# Patient Record
Sex: Female | Born: 1935 | Race: White | Hispanic: No | Marital: Single
Health system: Southern US, Community
[De-identification: ages and names within clinical notes are randomized; demographics above are authoritative.]

## PROBLEM LIST (undated history)

## (undated) DIAGNOSIS — I10 Essential (primary) hypertension: Secondary | ICD-10-CM

## (undated) DIAGNOSIS — I2699 Other pulmonary embolism without acute cor pulmonale: Secondary | ICD-10-CM

## (undated) DIAGNOSIS — R42 Dizziness and giddiness: Secondary | ICD-10-CM

## (undated) DIAGNOSIS — I82409 Acute embolism and thrombosis of unspecified deep veins of unspecified lower extremity: Secondary | ICD-10-CM

## (undated) DIAGNOSIS — R262 Difficulty in walking, not elsewhere classified: Secondary | ICD-10-CM

## (undated) HISTORY — PX: BACK SURGERY: SHX140

## (undated) HISTORY — PX: OTHER SURGICAL HISTORY: SHX169

## (undated) SURGERY — OPEN REDUCTION INTERNAL FIXATION HIP
Anesthesia: Spinal | Laterality: Left

---

## 2015-09-21 ENCOUNTER — Encounter (HOSPITAL_COMMUNITY): Payer: Self-pay | Admitting: *Deleted

## 2015-09-21 ENCOUNTER — Inpatient Hospital Stay (HOSPITAL_COMMUNITY)
Admission: EM | Admit: 2015-09-21 | Discharge: 2015-09-26 | DRG: 481 | Disposition: A | Discharge status: Skilled Nursing Facility | Payer: Medicare Other | Attending: Internal Medicine | Admitting: Internal Medicine

## 2015-09-21 ENCOUNTER — Emergency Department (HOSPITAL_COMMUNITY): Payer: Medicare Other

## 2015-09-21 DIAGNOSIS — S72001A Fracture of unspecified part of neck of right femur, initial encounter for closed fracture: Secondary | ICD-10-CM | POA: Diagnosis present

## 2015-09-21 DIAGNOSIS — Z791 Long term (current) use of non-steroidal anti-inflammatories (NSAID): Secondary | ICD-10-CM

## 2015-09-21 DIAGNOSIS — Z01818 Encounter for other preprocedural examination: Secondary | ICD-10-CM | POA: Diagnosis not present

## 2015-09-21 DIAGNOSIS — M25552 Pain in left hip: Secondary | ICD-10-CM | POA: Diagnosis present

## 2015-09-21 DIAGNOSIS — D62 Acute posthemorrhagic anemia: Secondary | ICD-10-CM | POA: Diagnosis not present

## 2015-09-21 DIAGNOSIS — W19XXXA Unspecified fall, initial encounter: Secondary | ICD-10-CM | POA: Diagnosis present

## 2015-09-21 DIAGNOSIS — I10 Essential (primary) hypertension: Secondary | ICD-10-CM | POA: Diagnosis present

## 2015-09-21 DIAGNOSIS — Z419 Encounter for procedure for purposes other than remedying health state, unspecified: Secondary | ICD-10-CM

## 2015-09-21 DIAGNOSIS — E785 Hyperlipidemia, unspecified: Secondary | ICD-10-CM | POA: Diagnosis present

## 2015-09-21 DIAGNOSIS — S72002A Fracture of unspecified part of neck of left femur, initial encounter for closed fracture: Secondary | ICD-10-CM | POA: Diagnosis not present

## 2015-09-21 DIAGNOSIS — S72142A Displaced intertrochanteric fracture of left femur, initial encounter for closed fracture: Principal | ICD-10-CM | POA: Diagnosis present

## 2015-09-21 HISTORY — DX: Difficulty in walking, not elsewhere classified: R26.2

## 2015-09-21 HISTORY — DX: Essential (primary) hypertension: I10

## 2015-09-21 HISTORY — DX: Dizziness and giddiness: R42

## 2015-09-21 LAB — CBC WITH DIFFERENTIAL/PLATELET
BASOS ABS: 0.1 10*3/uL (ref 0.0–0.1)
Basophils Relative: 1 %
Eosinophils Absolute: 0.1 10*3/uL (ref 0.0–0.7)
Eosinophils Relative: 1 %
HEMATOCRIT: 38.9 % (ref 36.0–46.0)
Hemoglobin: 12.8 g/dL (ref 12.0–15.0)
LYMPHS PCT: 11 %
Lymphs Abs: 1.2 10*3/uL (ref 0.7–4.0)
MCH: 30.1 pg (ref 26.0–34.0)
MCHC: 32.9 g/dL (ref 30.0–36.0)
MCV: 91.5 fL (ref 78.0–100.0)
Monocytes Absolute: 0.6 10*3/uL (ref 0.1–1.0)
Monocytes Relative: 5 %
NEUTROS ABS: 9.1 10*3/uL — AB (ref 1.7–7.7)
Neutrophils Relative %: 82 %
PLATELETS: 209 10*3/uL (ref 150–400)
RBC: 4.25 MIL/uL (ref 3.87–5.11)
RDW: 13.4 % (ref 11.5–15.5)
WBC: 11 10*3/uL — AB (ref 4.0–10.5)

## 2015-09-21 LAB — BASIC METABOLIC PANEL
ANION GAP: 7 (ref 5–15)
BUN: 21 mg/dL — ABNORMAL HIGH (ref 6–20)
CO2: 26 mmol/L (ref 22–32)
Calcium: 9.4 mg/dL (ref 8.9–10.3)
Chloride: 104 mmol/L (ref 101–111)
Creatinine, Ser: 0.77 mg/dL (ref 0.44–1.00)
Glucose, Bld: 110 mg/dL — ABNORMAL HIGH (ref 65–99)
POTASSIUM: 3.9 mmol/L (ref 3.5–5.1)
SODIUM: 137 mmol/L (ref 135–145)

## 2015-09-21 LAB — TYPE AND SCREEN
ABO/RH(D): A POS
ANTIBODY SCREEN: NEGATIVE

## 2015-09-21 LAB — ABO/RH: ABO/RH(D): A POS

## 2015-09-21 LAB — PROTIME-INR
INR: 1
Prothrombin Time: 13.2 seconds (ref 11.4–15.2)

## 2015-09-21 SURGERY — Surgical Case
Anesthesia: *Unknown

## 2015-09-21 MED ORDER — MORPHINE SULFATE (PF) 2 MG/ML IV SOLN
0.5000 mg | INTRAVENOUS | Status: DC | PRN
  Administered 2015-09-21: 0.5 mg via INTRAVENOUS
  Filled 2015-09-21: qty 1

## 2015-09-21 MED ORDER — POLYETHYLENE GLYCOL 3350 17 G PO PACK
17.0000 g | PACK | Freq: Every day | ORAL | Status: DC | PRN
  Administered 2015-09-26: 17 g via ORAL
  Filled 2015-09-21: qty 1

## 2015-09-21 MED ORDER — HYDROCODONE-ACETAMINOPHEN 5-325 MG PO TABS
1.0000 | ORAL_TABLET | ORAL | Status: DC | PRN
  Administered 2015-09-21: 1 via ORAL
  Filled 2015-09-21: qty 1

## 2015-09-21 MED ORDER — ONDANSETRON HCL 4 MG/2ML IJ SOLN: 4.0000 mg | Freq: Three times a day (TID) | INTRAMUSCULAR | Status: DC | PRN

## 2015-09-21 MED ORDER — SODIUM CHLORIDE 0.9 % IV SOLN
INTRAVENOUS | Status: DC
  Administered 2015-09-22 (×2): via INTRAVENOUS

## 2015-09-21 MED ORDER — OXYBUTYNIN CHLORIDE ER 5 MG PO TB24
5.0000 mg | ORAL_TABLET | Freq: Every day | ORAL | Status: DC
  Administered 2015-09-22 – 2015-09-26 (×5): 5 mg via ORAL
  Filled 2015-09-21 (×5): qty 1

## 2015-09-21 MED ORDER — SODIUM CHLORIDE 0.45 % IV SOLN
INTRAVENOUS | Status: DC
  Administered 2015-09-21: 17:00:00 via INTRAVENOUS

## 2015-09-21 MED ORDER — SIMVASTATIN 40 MG PO TABS
40.0000 mg | ORAL_TABLET | Freq: Every day | ORAL | Status: DC
  Administered 2015-09-22 – 2015-09-25 (×4): 40 mg via ORAL
  Filled 2015-09-21 (×4): qty 1

## 2015-09-21 MED ORDER — MORPHINE SULFATE (PF) 4 MG/ML IV SOLN
4.0000 mg | INTRAVENOUS | Status: DC | PRN
  Administered 2015-09-21: 4 mg via INTRAVENOUS
  Filled 2015-09-21: qty 1

## 2015-09-21 MED ORDER — ONDANSETRON HCL 4 MG/2ML IJ SOLN
4.0000 mg | Freq: Once | INTRAMUSCULAR | Status: AC
  Administered 2015-09-21: 4 mg via INTRAVENOUS
  Filled 2015-09-21: qty 2

## 2015-09-21 MED ORDER — ENOXAPARIN SODIUM 40 MG/0.4ML ~~LOC~~ SOLN: 40.0000 mg | SUBCUTANEOUS | Status: DC

## 2015-09-21 MED ORDER — SODIUM CHLORIDE 0.9 % IV BOLUS (SEPSIS)
1000.0000 mL | Freq: Once | INTRAVENOUS | Status: AC
  Administered 2015-09-21: 1000 mL via INTRAVENOUS

## 2015-09-21 MED ORDER — TRAZODONE HCL 100 MG PO TABS
100.0000 mg | ORAL_TABLET | Freq: Every day | ORAL | Status: DC
  Administered 2015-09-21 – 2015-09-25 (×5): 100 mg via ORAL
  Filled 2015-09-21 (×5): qty 1

## 2015-09-21 MED ORDER — HYDROCODONE-ACETAMINOPHEN 5-325 MG PO TABS
1.0000 | ORAL_TABLET | Freq: Four times a day (QID) | ORAL | Status: DC | PRN
  Administered 2015-09-23 – 2015-09-24 (×3): 2 via ORAL
  Administered 2015-09-25 – 2015-09-26 (×3): 1 via ORAL
  Filled 2015-09-21 (×2): qty 2
  Filled 2015-09-21 (×3): qty 1
  Filled 2015-09-21: qty 2
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
## 2015-09-21 NOTE — Progress Notes (Signed)
EDCM discussed patient with [place] of [facility] and notified her of patient's admission to [facility].

## 2015-09-21 NOTE — ED Notes (Addendum)
Patient can go to floor at 1540

## 2015-09-21 NOTE — ED Notes (Signed)
Hospitalist states that pt will be going [facility]

## 2015-09-21 NOTE — Progress Notes (Signed)
Orthopedic Tech Progress Note Patient Details:  Megan MilchCallie Murillo Sep 14, 1923 [id number]  Musculoskeletal Traction Type of Traction: Bucks Skin Traction Traction Location: LLE Traction Weight: 5 lbs    Jennye MoccasinHughes, [name] 09/21/2015, 9:14 PM

## 2015-09-21 NOTE — ED Triage Notes (Signed)
Patient not in lobby when called for triage. 

## 2015-09-21 NOTE — ED Triage Notes (Signed)
Per EMS, pt from home.  Pt had fall this morning.  Pt had no LOC or head injury.  No blood thinners.  Pt c/o Left femur pain with movement.  Leg is shortened and rotated.  Pelvic binder and head immobilization on arrival.  Alert and Oriented.  Vitals:  146/70, hr 64, 95% ra, resp 18

## 2015-09-21 NOTE — Progress Notes (Signed)
Orthopedic Tech Progress Note Patient Details:  Megan MilchCallie Murillo 09-06-1923 [id number] Ortho visit delivered sandbag Patient ID: Megan Milchallie Murillo, female   DOB: 09-06-1923, 82 y.o.   MRN: [id number]   Jennye MoccasinHughes, [name] 09/21/2015, 8:38 PM
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
## 2015-09-21 NOTE — ED Notes (Signed)
[facility] to [facility]

## 2015-09-21 NOTE — Progress Notes (Signed)
[facility] Elizebeth KollerMary of [facility] her of [facility].

## 2015-09-22 ENCOUNTER — Encounter (HOSPITAL_COMMUNITY)
Admission: EM | Disposition: A | Discharge status: Skilled Nursing Facility | Payer: Self-pay | Source: Home / Self Care | Attending: Internal Medicine

## 2015-09-22 ENCOUNTER — Inpatient Hospital Stay (HOSPITAL_COMMUNITY): Payer: Medicare Other

## 2015-09-22 ENCOUNTER — Inpatient Hospital Stay (HOSPITAL_COMMUNITY): Payer: Medicare Other | Admitting: Anesthesiology

## 2015-09-22 DIAGNOSIS — Z01818 Encounter for other preprocedural examination: Secondary | ICD-10-CM

## 2015-09-22 DIAGNOSIS — I10 Essential (primary) hypertension: Secondary | ICD-10-CM

## 2015-09-22 DIAGNOSIS — S72002A Fracture of unspecified part of neck of left femur, initial encounter for closed fracture: Secondary | ICD-10-CM

## 2015-09-22 HISTORY — PX: COMPRESSION HIP SCREW: SHX1386

## 2015-09-22 LAB — MRSA PCR SCREENING: MRSA by PCR: NEGATIVE

## 2015-09-22 LAB — TYPE AND SCREEN
ABO/RH(D): A POS
Antibody Screen: NEGATIVE

## 2015-09-22 LAB — URINE MICROSCOPIC-ADD ON: RBC / HPF: NONE SEEN RBC/hpf (ref 0–5)

## 2015-09-22 LAB — URINALYSIS, ROUTINE W REFLEX MICROSCOPIC
Bilirubin Urine: NEGATIVE
Glucose, UA: NEGATIVE mg/dL
Hgb urine dipstick: NEGATIVE
Ketones, ur: NEGATIVE mg/dL
Nitrite: NEGATIVE
Protein, ur: NEGATIVE mg/dL
Specific Gravity, Urine: 1.018 (ref 1.005–1.030)
pH: 5 (ref 5.0–8.0)

## 2015-09-22 LAB — ABO/RH: ABO/RH(D): A POS

## 2015-09-22 SURGERY — COMPRESSION HIP
Anesthesia: General | Laterality: Left

## 2015-09-22 MED ORDER — LIDOCAINE 2% (20 MG/ML) 5 ML SYRINGE
INTRAMUSCULAR | Status: DC | PRN
  Administered 2015-09-22: 60 mg via INTRAVENOUS

## 2015-09-22 MED ORDER — LACTATED RINGERS IV SOLN
INTRAVENOUS | Status: DC | PRN
  Administered 2015-09-22: 15:00:00 via INTRAVENOUS

## 2015-09-22 MED ORDER — FENTANYL CITRATE (PF) 100 MCG/2ML IJ SOLN
INTRAMUSCULAR | Status: AC
  Filled 2015-09-22: qty 2

## 2015-09-22 MED ORDER — ASPIRIN EC 325 MG PO TBEC: 325.0000 mg | DELAYED_RELEASE_TABLET | Freq: Two times a day (BID) | ORAL | 0 refills | Status: DC

## 2015-09-22 MED ORDER — HYDROCODONE-ACETAMINOPHEN 5-325 MG PO TABS: 1.0000 | ORAL_TABLET | Freq: Four times a day (QID) | ORAL | 0 refills | Status: DC | PRN

## 2015-09-22 MED ORDER — PROPOFOL 10 MG/ML IV BOLUS
INTRAVENOUS | Status: DC | PRN
  Administered 2015-09-22: 70 mg via INTRAVENOUS

## 2015-09-22 MED ORDER — ONDANSETRON HCL 4 MG/2ML IJ SOLN: 4.0000 mg | Freq: Once | INTRAMUSCULAR | Status: DC | PRN

## 2015-09-22 MED ORDER — PHENYLEPHRINE 40 MCG/ML (10ML) SYRINGE FOR IV PUSH (FOR BLOOD PRESSURE SUPPORT)
PREFILLED_SYRINGE | INTRAVENOUS | Status: AC
  Filled 2015-09-22: qty 10

## 2015-09-22 MED ORDER — ROCURONIUM BROMIDE 10 MG/ML (PF) SYRINGE
PREFILLED_SYRINGE | INTRAVENOUS | Status: AC
  Filled 2015-09-22: qty 10

## 2015-09-22 MED ORDER — ACETAMINOPHEN 325 MG PO TABS: 650.0000 mg | ORAL_TABLET | Freq: Four times a day (QID) | ORAL | Status: DC | PRN

## 2015-09-22 MED ORDER — CEFAZOLIN SODIUM-DEXTROSE 2-4 GM/100ML-% IV SOLN
INTRAVENOUS | Status: AC
  Filled 2015-09-22: qty 100

## 2015-09-22 MED ORDER — CEFAZOLIN SODIUM-DEXTROSE 2-3 GM-% IV SOLR
INTRAVENOUS | Status: DC | PRN
  Administered 2015-09-22: 2 g via INTRAVENOUS

## 2015-09-22 MED ORDER — ASPIRIN EC 325 MG PO TBEC
325.0000 mg | DELAYED_RELEASE_TABLET | Freq: Two times a day (BID) | ORAL | Status: DC
  Administered 2015-09-22 – 2015-09-26 (×8): 325 mg via ORAL
  Filled 2015-09-22 (×8): qty 1

## 2015-09-22 MED ORDER — CHLORHEXIDINE GLUCONATE 4 % EX LIQD
60.0000 mL | Freq: Once | CUTANEOUS | Status: AC
  Administered 2015-09-22: 4 via TOPICAL

## 2015-09-22 MED ORDER — DEXAMETHASONE SODIUM PHOSPHATE 10 MG/ML IJ SOLN
INTRAMUSCULAR | Status: DC | PRN
  Administered 2015-09-22: 4 mg via INTRAVENOUS

## 2015-09-22 MED ORDER — PROPOFOL 10 MG/ML IV BOLUS
INTRAVENOUS | Status: AC
  Filled 2015-09-22: qty 20

## 2015-09-22 MED ORDER — KCL IN DEXTROSE-NACL 20-5-0.45 MEQ/L-%-% IV SOLN: INTRAVENOUS | Status: DC

## 2015-09-22 MED ORDER — EPHEDRINE SULFATE 50 MG/ML IJ SOLN
INTRAMUSCULAR | Status: AC
  Filled 2015-09-22: qty 1

## 2015-09-22 MED ORDER — FENTANYL CITRATE (PF) 100 MCG/2ML IJ SOLN: 25.0000 ug | INTRAMUSCULAR | Status: DC | PRN

## 2015-09-22 MED ORDER — METOCLOPRAMIDE HCL 5 MG/ML IJ SOLN: 5.0000 mg | Freq: Three times a day (TID) | INTRAMUSCULAR | Status: DC | PRN

## 2015-09-22 MED ORDER — PHENYLEPHRINE 40 MCG/ML (10ML) SYRINGE FOR IV PUSH (FOR BLOOD PRESSURE SUPPORT)
PREFILLED_SYRINGE | INTRAVENOUS | Status: DC | PRN
  Administered 2015-09-22: 40 ug via INTRAVENOUS

## 2015-09-22 MED ORDER — BUPIVACAINE-EPINEPHRINE (PF) 0.25% -1:200000 IJ SOLN
INTRAMUSCULAR | Status: AC
  Filled 2015-09-22: qty 30

## 2015-09-22 MED ORDER — SUGAMMADEX SODIUM 200 MG/2ML IV SOLN
INTRAVENOUS | Status: DC | PRN
  Administered 2015-09-22: 120 mg via INTRAVENOUS

## 2015-09-22 MED ORDER — CEFAZOLIN SODIUM-DEXTROSE 2-4 GM/100ML-% IV SOLN
2.0000 g | INTRAVENOUS | Status: AC
  Administered 2015-09-22: 2 g via INTRAVENOUS
  Filled 2015-09-22: qty 100

## 2015-09-22 MED ORDER — PHENYLEPHRINE HCL 10 MG/ML IJ SOLN
INTRAMUSCULAR | Status: DC | PRN
  Administered 2015-09-22: 50 ug/min via INTRAVENOUS

## 2015-09-22 MED ORDER — LIDOCAINE 2% (20 MG/ML) 5 ML SYRINGE
INTRAMUSCULAR | Status: AC
  Filled 2015-09-22: qty 5

## 2015-09-22 MED ORDER — POVIDONE-IODINE 10 % EX SWAB
2.0000 | Freq: Once | CUTANEOUS | Status: DC
Start: 2015-09-22 — End: 2015-09-22

## 2015-09-22 MED ORDER — METHOCARBAMOL 500 MG PO TABS: 500.0000 mg | ORAL_TABLET | Freq: Two times a day (BID) | ORAL | 0 refills | Status: DC

## 2015-09-22 MED ORDER — TRIAMTERENE-HCTZ 37.5-25 MG PO TABS
1.0000 | ORAL_TABLET | Freq: Every day | ORAL | Status: DC
  Administered 2015-09-22 – 2015-09-26 (×5): 1 via ORAL
  Filled 2015-09-22 (×5): qty 1

## 2015-09-22 MED ORDER — METOCLOPRAMIDE HCL 5 MG PO TABS: 5.0000 mg | ORAL_TABLET | Freq: Three times a day (TID) | ORAL | Status: DC | PRN

## 2015-09-22 MED ORDER — ONDANSETRON HCL 4 MG PO TABS: 4.0000 mg | ORAL_TABLET | Freq: Four times a day (QID) | ORAL | Status: DC | PRN

## 2015-09-22 MED ORDER — PHENOL 1.4 % MT LIQD
1.0000 | OROMUCOSAL | Status: DC | PRN
Start: 2015-09-22 — End: 2015-09-26

## 2015-09-22 MED ORDER — FENTANYL CITRATE (PF) 100 MCG/2ML IJ SOLN
INTRAMUSCULAR | Status: DC | PRN
  Administered 2015-09-22 (×3): 25 ug via INTRAVENOUS
  Administered 2015-09-22: 50 ug via INTRAVENOUS
  Administered 2015-09-22: 25 ug via INTRAVENOUS
  Administered 2015-09-22: 50 ug via INTRAVENOUS

## 2015-09-22 MED ORDER — EPHEDRINE SULFATE-NACL 50-0.9 MG/10ML-% IV SOSY
PREFILLED_SYRINGE | INTRAVENOUS | Status: DC | PRN
  Administered 2015-09-22: 15 mg via INTRAVENOUS
  Administered 2015-09-22: 10 mg via INTRAVENOUS

## 2015-09-22 MED ORDER — DEXTROSE-NACL 5-0.45 % IV SOLN: INTRAVENOUS | Status: DC

## 2015-09-22 MED ORDER — LACTATED RINGERS IV SOLN
INTRAVENOUS | Status: DC
  Administered 2015-09-22 (×2): via INTRAVENOUS

## 2015-09-22 MED ORDER — ROCURONIUM BROMIDE 100 MG/10ML IV SOLN
INTRAVENOUS | Status: DC | PRN
  Administered 2015-09-22: 30 mg via INTRAVENOUS
  Administered 2015-09-22: 10 mg via INTRAVENOUS

## 2015-09-22 MED ORDER — BUPIVACAINE-EPINEPHRINE (PF) 0.5% -1:200000 IJ SOLN
INTRAMUSCULAR | Status: AC
  Filled 2015-09-22: qty 30

## 2015-09-22 MED ORDER — SUCCINYLCHOLINE CHLORIDE 200 MG/10ML IV SOSY
PREFILLED_SYRINGE | INTRAVENOUS | Status: AC
  Filled 2015-09-22: qty 10

## 2015-09-22 MED ORDER — ONDANSETRON HCL 4 MG/2ML IJ SOLN
4.0000 mg | Freq: Four times a day (QID) | INTRAMUSCULAR | Status: DC | PRN
Start: 2015-09-22 — End: 2015-09-26

## 2015-09-22 MED ORDER — MENTHOL 3 MG MT LOZG: 1.0000 | LOZENGE | OROMUCOSAL | Status: DC | PRN

## 2015-09-22 MED ORDER — SUGAMMADEX SODIUM 200 MG/2ML IV SOLN
INTRAVENOUS | Status: AC
  Filled 2015-09-22: qty 2

## 2015-09-22 MED ORDER — ONDANSETRON HCL 4 MG/2ML IJ SOLN
INTRAMUSCULAR | Status: AC
  Filled 2015-09-22: qty 2

## 2015-09-22 MED ORDER — ACETAMINOPHEN 650 MG RE SUPP: 650.0000 mg | Freq: Four times a day (QID) | RECTAL | Status: DC | PRN

## 2015-09-22 MED ORDER — ONDANSETRON HCL 4 MG/2ML IJ SOLN
INTRAMUSCULAR | Status: DC | PRN
  Administered 2015-09-22: 4 mg via INTRAVENOUS

## 2015-09-22 MED ORDER — BUPIVACAINE-EPINEPHRINE 0.5% -1:200000 IJ SOLN
INTRAMUSCULAR | Status: DC | PRN
  Administered 2015-09-22: 20 mL

## 2015-09-22 SURGICAL SUPPLY — 61 items
BIT DRILL 3.8MM (BIT) ×1
BIT DRILL TWIST 3.8X7 (BIT) ×2 IMPLANT
BNDG COHESIVE 4X5 TAN STRL (GAUZE/BANDAGES/DRESSINGS) ×3 IMPLANT
BNDG COHESIVE 6X5 TAN STRL LF (GAUZE/BANDAGES/DRESSINGS) ×3 IMPLANT
BNDG GAUZE ELAST 4 BULKY (GAUZE/BANDAGES/DRESSINGS) ×3 IMPLANT
COVER PERINEAL POST (MISCELLANEOUS) ×3 IMPLANT
COVER SURGICAL LIGHT HANDLE (MISCELLANEOUS) ×3 IMPLANT
DECANTER SPIKE VIAL GLASS SM (MISCELLANEOUS) ×3 IMPLANT
DRAPE C-ARM 42X72 X-RAY (DRAPES) ×3 IMPLANT
DRAPE INCISE IOBAN 66X45 STRL (DRAPES) ×3 IMPLANT
DRAPE ORTHO SPLIT 77X108 STRL (DRAPES) ×4
DRAPE PROXIMA HALF (DRAPES) ×12 IMPLANT
DRAPE SURG ORHT 6 SPLT 77X108 (DRAPES) ×2 IMPLANT
DRAPE U-SHAPE 47X51 STRL (DRAPES) ×3 IMPLANT
DRILL BIT 7/64X5 (BIT) IMPLANT
DRSG AQUACEL AG ADV 3.5X10 (GAUZE/BANDAGES/DRESSINGS) ×3 IMPLANT
DRSG MEPILEX BORDER 4X4 (GAUZE/BANDAGES/DRESSINGS) ×3 IMPLANT
DRSG MEPILEX BORDER 4X8 (GAUZE/BANDAGES/DRESSINGS) ×3 IMPLANT
DRSG PAD ABDOMINAL 8X10 ST (GAUZE/BANDAGES/DRESSINGS) ×3 IMPLANT
DURAPREP 26ML APPLICATOR (WOUND CARE) ×3 IMPLANT
ELECT CAUTERY BLADE 6.4 (BLADE) ×3 IMPLANT
ELECT REM PT RETURN 9FT ADLT (ELECTROSURGICAL) ×3
ELECTRODE REM PT RTRN 9FT ADLT (ELECTROSURGICAL) ×1 IMPLANT
EVACUATOR 1/8 PVC DRAIN (DRAIN) IMPLANT
GAUZE XEROFORM 5X9 LF (GAUZE/BANDAGES/DRESSINGS) ×3 IMPLANT
GLOVE BIO SURGEON STRL SZ7.5 (GLOVE) ×3 IMPLANT
GLOVE BIO SURGEON STRL SZ8.5 (GLOVE) ×3 IMPLANT
GLOVE BIOGEL PI IND STRL 8 (GLOVE) ×1 IMPLANT
GLOVE BIOGEL PI IND STRL 9 (GLOVE) ×1 IMPLANT
GLOVE BIOGEL PI INDICATOR 8 (GLOVE) ×2
GLOVE BIOGEL PI INDICATOR 9 (GLOVE) ×2
GOWN STRL REUS W/ TWL LRG LVL3 (GOWN DISPOSABLE) ×3 IMPLANT
GOWN STRL REUS W/ TWL XL LVL3 (GOWN DISPOSABLE) ×1 IMPLANT
GOWN STRL REUS W/TWL LRG LVL3 (GOWN DISPOSABLE) ×6
GOWN STRL REUS W/TWL XL LVL3 (GOWN DISPOSABLE) ×2
KIT BASIN OR (CUSTOM PROCEDURE TRAY) ×3 IMPLANT
KIT ROOM TURNOVER OR (KITS) ×3 IMPLANT
LINER BOOT UNIVERSAL DISP (MISCELLANEOUS) ×3 IMPLANT
MANIFOLD NEPTUNE II (INSTRUMENTS) ×3 IMPLANT
NS IRRIG 1000ML POUR BTL (IV SOLUTION) ×3 IMPLANT
PACK GENERAL/GYN (CUSTOM PROCEDURE TRAY) ×3 IMPLANT
PAD ARMBOARD 7.5X6 YLW CONV (MISCELLANEOUS) ×6 IMPLANT
PIN THREADED GUIDE ACE (PIN) ×6 IMPLANT
PLATE COMP 140D 4H STD (Plate) ×3 IMPLANT
SCREW ACECAP 36MM (Screw) ×6 IMPLANT
SCREW ACECAP 40MM (Screw) ×6 IMPLANT
SCREW LAG 80MM (Screw) ×2 IMPLANT
SCREW LAG STD 80XKY LRG STRL H (Screw) ×1 IMPLANT
STAPLER VISISTAT 35W (STAPLE) ×3 IMPLANT
STOCKINETTE IMPERVIOUS LG (DRAPES) ×3 IMPLANT
SUT ETHILON 3 0 FSL (SUTURE) ×3 IMPLANT
SUT VIC AB 0 CT1 27 (SUTURE) ×2
SUT VIC AB 0 CT1 27XBRD ANBCTR (SUTURE) ×1 IMPLANT
SUT VIC AB 1 CT1 27 (SUTURE) ×2
SUT VIC AB 1 CT1 27XBRD ANTBC (SUTURE) ×1 IMPLANT
SUT VIC AB 2-0 CTB1 (SUTURE) ×3 IMPLANT
SYR CONTROL 10ML LL (SYRINGE) ×3 IMPLANT
TAPE STRIPS DRAPE STRL (GAUZE/BANDAGES/DRESSINGS) ×3 IMPLANT
TOWEL OR 17X24 6PK STRL BLUE (TOWEL DISPOSABLE) ×3 IMPLANT
TOWEL OR 17X26 10 PK STRL BLUE (TOWEL DISPOSABLE) ×3 IMPLANT
WATER STERILE IRR 1000ML POUR (IV SOLUTION) ×3 IMPLANT

## 2015-09-22 NOTE — Progress Notes (Signed)
Called short stay to give report, pt is all set for surgery

## 2015-09-22 NOTE — Transfer of Care (Signed)
Immediate Anesthesia Transfer of Care Note  [facility]  Procedure(s) Performed: Procedure(s): ORIF INTERTROCHANTRIC HIP (Left)  Patient Location: PACU  Anesthesia Type:General  Level of Consciousness: awake, alert  and confused  Airway & Oxygen Therapy: Patient Spontanous Breathing and Patient connected to nasal cannula oxygen  Post-op Assessment: Report given to RN, Post -op Vital signs reviewed and stable and Patient moving all extremities  Post vital signs: Reviewed and stable  Last Vitals:  Vitals:   09/21/15 2033 09/22/15 0424  BP: (!) 149/61 128/81  Pulse: 67 76  Resp: 18 16  Temp: 37.1 C 36.9 C    Last Pain:  Vitals:   09/22/15 1200  TempSrc:   PainSc: 2          Complications: No apparent anesthesia complications

## 2015-09-22 NOTE — Anesthesia Procedure Notes (Signed)
Procedure Name: Intubation Date/Time: 09/22/2015 2:42 PM Performed by: Orvilla FusATO, Asmar Brozek A Pre-anesthesia Checklist: Patient identified, Emergency Drugs available, Suction available, Timeout performed and Patient being monitored Patient Re-evaluated:Patient Re-evaluated prior to inductionOxygen Delivery Method: Circle system utilized Preoxygenation: Pre-oxygenation with 100% oxygen Intubation Type: IV induction Ventilation: Mask ventilation without difficulty Laryngoscope Size: Mac and 3 Grade View: Grade I Tube type: Oral Tube size: 7.0 mm Number of attempts: 1 Airway Equipment and Method: Stylet Placement Confirmation: ETT inserted through vocal cords under direct vision,  positive ETCO2 and breath sounds checked- equal and bilateral Secured at: 21 cm Tube secured with: Tape Dental Injury: Teeth and Oropharynx as per pre-operative assessment

## 2015-09-22 NOTE — Progress Notes (Signed)
Patient arrived to floor awake with family, Alert and orientated x3, resting comforable, dinner called in and orders reviewed

## 2015-09-22 NOTE — Progress Notes (Signed)
SW attempted to meet with pt. However, pt is not present. Nurse states pt has been transported to surgery.   Crista CurbBrittney Denessa Cavan, Social Worker [phone]

## 2015-09-22 NOTE — Anesthesia Postprocedure Evaluation (Signed)
Anesthesia Post Note  Patient: Megan Murillo  Procedure(s) Performed: Procedure(s) (LRB): ORIF INTERTROCHANTRIC HIP (Left)  Patient location during evaluation: PACU Anesthesia Type: General Level of consciousness: awake and alert Pain management: pain level controlled Vital Signs Assessment: post-procedure vital signs reviewed and stable Respiratory status: spontaneous breathing, nonlabored ventilation, respiratory function stable and patient connected to nasal cannula oxygen Cardiovascular status: blood pressure returned to baseline and stable Postop Assessment: no signs of nausea or vomiting Anesthetic complications: no    Last Vitals:  Vitals:   09/22/15 1655 09/22/15 1713  BP:  (!) 137/55  Pulse: 81 79  Resp: 20 18  Temp: 36.7 C 36.5 C    Last Pain:  Vitals:   09/22/15 1713  TempSrc: Oral  PainSc:                  Linton RumpJennifer Dickerson Noralee Dutko

## 2015-09-22 NOTE — Progress Notes (Signed)
PT Cancellation Note  Patient Details Name: Oretha MilchCallie Knapik MRN: [id number] DOB: September 15, 1923   Cancelled Treatment:    Reason Eval/Treat Not Completed: Pt going to surgery today, will follow as appropriate.    Christiane HaBenjamin J. Tamyia Minich, PT, CSCS Pager [phone] Office [phone]  09/22/2015, 11:31 AM

## 2015-09-22 NOTE — Anesthesia Preprocedure Evaluation (Addendum)
Anesthesia Evaluation  Patient identified by MRN, date of birth, ID band Patient awake    Reviewed: Allergy & Precautions, NPO status , Patient's Chart, lab work & pertinent test results  History of Anesthesia Complications Negative for: history of anesthetic complications  Airway Mallampati: III  TM Distance: >3 FB Neck ROM: Limited   Comment: Small mouth Dental  (+) Edentulous Upper, Dental Advisory Given   Pulmonary neg pulmonary ROS, neg shortness of breath, neg sleep apnea, neg COPD, neg recent URI,    Pulmonary exam normal breath sounds clear to auscultation       Cardiovascular hypertension, (-) angina(-) Past MI, (-) Cardiac Stents, (-) CABG, (-) Orthopnea and (-) PND + dysrhythmias (first degree AV block)  Rhythm:Regular Rate:Normal  EKG 09/21/15: sinus rhythm with first degree AV block   Neuro/Psych neg Seizures vertigo    GI/Hepatic negative GI ROS, Neg liver ROS, neg GERD  ,  Endo/Other  negative endocrine ROSneg diabetes  Renal/GU      Musculoskeletal   Abdominal (+) - obese,   Peds  Hematology negative hematology ROS (+)   Anesthesia Other Findings HLD  Patient reports neck pain secondary to the fall  Reproductive/Obstetrics                           Anesthesia Physical Anesthesia Plan  ASA: II  Anesthesia Plan: General   Post-op Pain Management:    Induction: Intravenous  Airway Management Planned: Oral ETT and Video Laryngoscope Planned  Additional Equipment:   Intra-op Plan:   Post-operative Plan: Extubation in OR  Informed Consent: I have reviewed the patients History and Physical, chart, labs and discussed the procedure including the risks, benefits and alternatives for the proposed anesthesia with the patient or authorized representative who has indicated his/her understanding and acceptance.   Dental advisory given  Plan Discussed with:   Anesthesia Plan  Comments: (Discussed risks and benefits of neuraxial anesthesia versus general anesthesia. Patient elected to have general anesthesia. Discussed possibility of postop cognitive dysfunction with patient and family. Patient declined neuraxial anesthesia.)        Anesthesia Quick Evaluation

## 2015-09-22 NOTE — Progress Notes (Signed)
Pt is ready for surgery consents have been sign CHG bath has be given.

## 2015-09-22 NOTE — Op Note (Signed)
DATE OF PROCEDURE: 09/12/2011  PREOPERATIVE DIAGNOSIS: L hip intertrochanteric fracture 3-part with varus displacement  POSTOPERATIVE DIAGNOSIS: Same  PROCEDURE: Open reduction internal fixation left hip intertrochanteric fracture using a DePuy TK 2 4 hole 140 short barrel sideplate, 80 mm lag screw keyed  SURGEON: Amariyon Maynes J  ASSISTANT: Eric K. Gaylene BrooksPhillips PA-C  (present throughout entire procedure and necessary for timely completion of the procedure) ANESTHESIA: General  BLOOD LOSS: 200 cc  FLUID REPLACEMENT: 1200 cc crystalloid  DRAINS: Foley Catheter  URINE OUTPUT: 300cc  COMPLICATIONS: none   INDICATIONS FOR PROCEDURE: L hip intertrochanteric fracture, 3-part, sustained from a fal. Patient. presented to the emergency room, was admitted by the medicine service and orthopedic consultation was obtained. To decrease pain and increase function we have recommended open reduction internal fixation using a dynamic hip screw. The risks, benefits, and alternatives were discussed at length including but not limited to the risks of infection, bleeding, nerve injury, stiffness, blood clots, the need for revision surgery, cardiopulmonary complications, among others, and they were willing to proceed. Benefits have been discussed. Questions answered.   PROCEDURE IN DETAIL: The patient was identified by armband,  received preoperative IV antibiotics in the holding area, taken to the operating room , appropriate anesthetic monitors were attached and general endotracheal anesthesia induced. Pt. was then transferred to a radiolucent flat Jackson table, rolled into the R lateral decubitus position and fixed there with a Stulberg Mark 2 pelvic clamp. Under C-arm imaging control we then performed a closed reduction with abduction and internal rotation obtaining a near-anatomic reduction with the leg in full extension. The lateral aspect of the hip and thigh was then prepped and draped in usual sterile fashion in the  iliac crest to the knee. A timeout procedure was performed. A 10 centimeter incision starting out at the flare of the greater trochanter and going distally was made along the lateral thigh through the skin and subcutaneous tissue down to the level of the IT band which was then cut in line with the skin incision exposing the vastus lateralis. This was likewise split taking us down to the flare of the greater trochanter and down the lateral side of the femur for about 8-10 cm. Under C-arm image control we then placed a guide pin at at 135 angle to the lateral flare of the greater trochanter up the femoral neck and into the center of the femoral head on the AP and lateral views. This measured at 90 mm and the triple reamer was set at 80 mm. Bone quality was actually quite good during the reaming. We then used to tap to prepare the drill hole for an 80 mm lag screw, which was placed without difficulty over the guide pin. We then selected a 135 4-hole sideplate placed it over the lag screw and fixed it to the lateral femur with 4 bicortical 4.5 mm screws. C-arm images were taken confirming a near-anatomic reduction. The wound was then thoroughly irrigated with normal saline solution. The vastus lateralis was closed with running 0 Vicryl suture, the IT band with running #1 Vicryl suture, the subcutaneous tissue with 0 and 2-0 undyed Vicryl suture and the skin with 3-0 SQ vicryl. A dressing of Mepilex was then applied the patient was unclamped rolled supine awakened extubated and taken to the recovery room without difficulty.  Gean BirchwoodOWAN,Skyah Hannon J  09/12/2011, 7:29 PM

## 2015-09-22 NOTE — Care Management Note (Signed)
Case Management Note  Patient Details  Name: Megan MilchCallie Murillo MRN: [id number] Date of Birth: 1923-09-10  Subjective/Objective:    Closed hip fracture, surgery 09/22/2015                Action/Plan: Discharge Planning:  [facility]. Surgery scheduled 09/22/2015, waiting PT evaluation. Pt may need SNF rehab.    Expected Discharge Date:               Expected Discharge Plan:  Skilled Nursing Facility  In-House Referral:  Clinical Social Work  Discharge planning Services  CM Consult  Post Acute Care Choice:  Home Health, Resumption of Svcs/PTA Provider ([facility])   Choice offered to:     DME Arranged:  N/A DME Agency:  NA  HH Arranged:    HH Agency:     Status of Service:  In process, will continue to follow  If discussed at Long Length of Stay Meetings, dates discussed:    Additional Comments:  Elliot CousinShavis, Brooke Steinhilber Ellen, RN 09/22/2015, 3:16 PM

## 2015-09-22 NOTE — Discharge Instructions (Signed)
Hip Fracture A hip fracture is a fracture of the upper part of your thigh bone (femur).  CAUSES A hip fracture is caused by a direct blow to the side of your hip. This is usually the result of a fall but can occur in other circumstances, such as an automobile accident. RISK FACTORS There is an increased risk of hip fractures in people with:  An unsteady walking pattern (gait) and those with conditions that contribute to poor balance, such as Parkinson's disease or dementia.  Osteopenia and osteoporosis.  Cancer that spreads to the leg bones.  Certain metabolic diseases. SYMPTOMS  Symptoms of hip fracture include:  Pain over the injured hip.  Inability to put weight on the leg in which the fracture occurred (although, some patients are able to walk after a hip fracture).  Toes and foot of the affected leg point outward when you lie down. DIAGNOSIS A physical exam can determine if a hip fracture is likely to have occurred. X-ray exams are needed to confirm the fracture and to look for other injuries. The X-ray exam can help to determine the type of hip fracture. Rarely, the fracture is not visible on an X-ray image and a CT scan or MRI will have to be done. TREATMENT  The treatment for a fracture is usually surgery. This means using a screw, nail, or rod to hold the bones in place.  HOME CARE INSTRUCTIONS Take all medicines as directed by your health care provider. SEEK MEDICAL CARE IF: Pain continues, even after taking pain medicine. MAKE SURE YOU:  Understand these instructions.   Will watch your condition.  Will get help right away if you are not doing well or get worse.   This information is not intended to replace advice given to you by your health care provider. Make sure you discuss any questions you have with your health care provider.   Document Released: 01/21/2005 Document Revised: 01/26/2013 Document Reviewed: 09/02/2012 Elsevier Interactive Patient Education 2016  Elsevier Inc.  

## 2015-09-22 NOTE — Progress Notes (Signed)
PROGRESS NOTE  Megan MilchCallie Hocevar  VHQ:[id number] DOB: 1923-02-19 DOA: 09/21/2015 PCP: [name],[name], [name] Outpatient Specialists:  Subjective: Feels okay, denies any new complaints.  Brief Narrative:  Megan Murillo is a 82 y.o. female with medical history significant of past medical history of essential hypertension that comes in for an episode of fall that happened on the day of admission the patient relates she was in the bathroom brushing her hair when her left foot gave out and she felt to the floor left hip first she did not hit her head after this she cannot bear weight she had to crawl back in bed in call her son. She relates she has pain with any type of movement of the left hip  Assessment & Plan:   Active Problems:   Closed left hip fracture (HCC)   Essential hypertension   Pre-op evaluation   Closed left hip fracture (HCC)/preop evaluation: -Resulted from a fall, appears to be mechanical, no loss of consciousness. -Orthopedics consulted, for surgical repair today. -Lovenox for DVT prophylaxis. -Patient is controlled. -Likely she will need nursing home placement on discharge.  Essential hypertension: Hold antihypertensive medication can be recent in the morning once patient is able to take orals.   DVT prophylaxis: Subcutaneous Lovenox Code Status: Full Code Family Communication:  Disposition Plan:  Diet: Diet NPO time specified Diet NPO time specified Except for: Sips with Meds  Consultants:   Ortho  Procedures:   None  Antimicrobials:   None  Objective: Vitals:   09/21/15 1423 09/21/15 1702 09/21/15 2033 09/22/15 0424  BP: 134/70 138/76 (!) 149/61 128/81  Pulse: 75 92 67 76  Resp: 17 22 18 16   Temp:   98.8 F (37.1 C) 98.4 F (36.9 C)  TempSrc:   Oral Oral  SpO2: 97% 95% 95% 96%    Intake/Output Summary (Last 24 hours) at 09/22/15 1159 Last data filed at 09/22/15 1108  Gross per 24 hour  Intake                0 ml  Output             1675 ml   Net            -1675 ml   There were no vitals filed for this visit.  Examination: General exam: Appears calm and comfortable  Respiratory system: Clear to auscultation. Respiratory effort normal. Cardiovascular system: S1 & S2 heard, RRR. No JVD, murmurs, rubs, gallops or clicks. No pedal edema. Gastrointestinal system: Abdomen is nondistended, soft and nontender. No organomegaly or masses felt. Normal bowel sounds heard. Central nervous system: Alert and oriented. No focal neurological deficits. Extremities: Symmetric 5 x 5 power. Skin: No rashes, lesions or ulcers Psychiatry: Judgement and insight appear normal. Mood & affect appropriate.   Data Reviewed: I have personally reviewed following labs and imaging studies  CBC:  Recent Labs Lab 09/21/15 1240  WBC 11.0*  NEUTROABS 9.1*  HGB 12.8  HCT 38.9  MCV 91.5  PLT 209   Basic Metabolic Panel:  Recent Labs Lab 09/21/15 1240  NA 137  K 3.9  CL 104  CO2 26  GLUCOSE 110*  BUN 21*  CREATININE 0.77  CALCIUM 9.4   GFR: CrCl cannot be calculated (Unknown ideal weight.). Liver Function Tests: No results for input(s): AST, ALT, ALKPHOS, BILITOT, PROT, ALBUMIN in the last 168 hours. No results for input(s): LIPASE, AMYLASE in the last 168 hours. No results for input(s): AMMONIA in the last 168 hours. Coagulation Profile:  Recent Labs Lab 09/21/15 1240  INR 1.00   Cardiac Enzymes: No results for input(s): CKTOTAL, CKMB, CKMBINDEX, TROPONINI in the last 168 hours. BNP (last 3 results) No results for input(s): PROBNP in the last 8760 hours. HbA1C: No results for input(s): HGBA1C in the last 72 hours. CBG: No results for input(s): GLUCAP in the last 168 hours. Lipid Profile: No results for input(s): CHOL, HDL, LDLCALC, TRIG, CHOLHDL, LDLDIRECT in the last 72 hours. Thyroid Function Tests: No results for input(s): TSH, T4TOTAL, FREET4, T3FREE, THYROIDAB in the last 72 hours. Anemia Panel: No results for  input(s): VITAMINB12, FOLATE, FERRITIN, TIBC, IRON, RETICCTPCT in the last 72 hours. Urine analysis:    Component Value Date/Time   COLORURINE YELLOW 09/22/2015 0521   APPEARANCEUR CLEAR 09/22/2015 0521   LABSPEC 1.018 09/22/2015 0521   PHURINE 5.0 09/22/2015 0521   GLUCOSEU NEGATIVE 09/22/2015 0521   HGBUR NEGATIVE 09/22/2015 0521   BILIRUBINUR NEGATIVE 09/22/2015 0521   KETONESUR NEGATIVE 09/22/2015 0521   PROTEINUR NEGATIVE 09/22/2015 0521   NITRITE NEGATIVE 09/22/2015 0521   LEUKOCYTESUR TRACE (A) 09/22/2015 0521   Sepsis Labs: @LABRCNTIP (procalcitonin:4,lacticidven:4)  )No results found for this or any previous visit (from the past 240 hour(s)).   Invalid input(s): PROCALCITONIN, LACTICACIDVEN   Radiology Studies: Dg Hip Unilat With Pelvis 2-3 Views Left  Result Date: 09/21/2015 CLINICAL DATA:  Fall today, left hip pain. EXAM: DG HIP (WITH OR WITHOUT PELVIS) 2-3V LEFT COMPARISON:  None. FINDINGS: There is a displaced/ comminuted fracture within the intratrochanteric region of the left femoral neck, with associated avulsion of the lesser trochanter. There is near 90 degrees angulation deformity at the fracture site. Femoral head remains well positioned relative to the acetabulum. No fracture seen within the adjacent osseous pelvis. Mild degenerative change noted in the lower lumbar spine. Soft tissues about the pelvis and left hip are unremarkable. IMPRESSION: Acute displaced/comminuted fracture within the left femoral neck, intertrochanteric, with associated angulation deformity at the fracture site and avulsion of the lesser trochanter. Left femoral head remains well positioned relative to the acetabulum. No fracture seen within the adjacent osseous pelvis. Electronically Signed   By: Bary RichardStan  Maynard M.D.   On: 09/21/2015 13:49        Scheduled Meds: .  ceFAZolin (ANCEF) IV  2 g Intravenous On Call to OR  . enoxaparin (LOVENOX) injection  40 mg Subcutaneous Q24H  . oxybutynin   5 mg Oral Daily  . povidone-iodine  2 application Topical Once  . simvastatin  40 mg Oral q1800  . traZODone  100 mg Oral QHS   Continuous Infusions: . sodium chloride 125 mL/hr at 09/22/15 1147  . dextrose 5 % and 0.45% NaCl       LOS: 1 day    Time spent: 35 minutes    Yu Cragun A, MD [facility] Pager [phone]  If 7PM-7AM, please contact night-coverage [email or web address] Password [email or web address] 09/22/2015, 11:59 AM

## 2015-09-23 LAB — CBC
HCT: 31.3 % — ABNORMAL LOW (ref 36.0–46.0)
HEMOGLOBIN: 9.8 g/dL — AB (ref 12.0–15.0)
MCH: 29 pg (ref 26.0–34.0)
MCHC: 31.3 g/dL (ref 30.0–36.0)
MCV: 92.6 fL (ref 78.0–100.0)
PLATELETS: 137 10*3/uL — AB (ref 150–400)
RBC: 3.38 MIL/uL — AB (ref 3.87–5.11)
RDW: 13.2 % (ref 11.5–15.5)
WBC: 9 10*3/uL (ref 4.0–10.5)

## 2015-09-23 LAB — BASIC METABOLIC PANEL
ANION GAP: 7 (ref 5–15)
BUN: 10 mg/dL (ref 6–20)
CHLORIDE: 104 mmol/L (ref 101–111)
CO2: 25 mmol/L (ref 22–32)
Calcium: 8.7 mg/dL — ABNORMAL LOW (ref 8.9–10.3)
Creatinine, Ser: 0.67 mg/dL (ref 0.44–1.00)
Glucose, Bld: 121 mg/dL — ABNORMAL HIGH (ref 65–99)
POTASSIUM: 3.7 mmol/L (ref 3.5–5.1)
SODIUM: 136 mmol/L (ref 135–145)

## 2015-09-23 NOTE — Clinical Social Work Note (Signed)
Clinical Social Work Assessment  Patient Details  Name: Megan Murillo MRN: [id number] Date of Birth: August 07, 1923  Date of referral:  09/23/15               Reason for consult:  Discharge Planning                Permission sought to share information with:  Case Manager, Facility Sport and exercise psychologist, Family Supports Permission granted to share information::  Yes, Verbal Permission Granted  Name::        Agency::   (SNF)  Relationship::     Contact Information:     Housing/Transportation Living arrangements for the past 2 months:  Apartment Source of Information:  Patient, Medical Team Patient Interpreter Needed:  None Criminal Activity/Legal Involvement Pertinent to Current Situation/Hospitalization:  No - Comment as needed Significant Relationships:  Adult Children, Siblings Lives with:  Self Do you feel safe going back to the place where you live?  No Need for family participation in patient care:  Yes (Comment)  Care giving concerns: Pt and family expressed that pt lives alone and that the pt son is unable to provide care pt needs once discharged from the hospital. Pt agreeable to SNF for short-term rehab for higher level of care.   Social Worker assessment / plan: Holiday representative met with pt and pt's son and pt's sister and discussed CSW role with discharge planning. CSW also explained PT recommendation for SNF for short-term rehab. Pt and family agreeable to SNF and pt requested [facility] as first choice for SNF.  CSW will follow up with bed offers once available.   Employment status:  Retired Forensic scientist:  Other (Comment Required) ([employer]) PT Recommendations:  [place] / Referral to community resources:  [place]  Patient/Family's Response to care: Pt pleasant and lying in bed with family at bedside. Pt and family appear happy with care pt is receiving at [facility].   Patient/Family's  Understanding of and Emotional Response to Diagnosis, Current Treatment, and Prognosis: Pt and family appear to have a good understanding of reason for pt admission into the hospital and of pt care plan.   Emotional Assessment Appearance:  Appears older than stated age, Well-Groomed Attitude/Demeanor/Rapport:   (Pleasant) Affect (typically observed):  Accepting, Appropriate, Pleasant Orientation:  Oriented to Place, Oriented to Self, Oriented to  Time, Oriented to Situation Alcohol / Substance use:  Not Applicable Psych involvement (Current and /or in the community):  No (Comment)  Discharge Needs  Concerns to be addressed:  Discharge Planning Concerns Readmission within the last 30 days:  No Current discharge risk:  None Barriers to Discharge:  Continued Medical Work up   Junie Spencer, LCSW 09/23/2015, 3:36 PM

## 2015-09-23 NOTE — Progress Notes (Signed)
PROGRESS NOTE  Megan MilchCallie Heidt  ZOX:[id number] DOB: [birth year] DOA: 09/21/2015 PCP: [name],[name], [name] Outpatient Specialists:  Subjective: Seen with her daughter-in-law at bedside, denies any complaints this morning.  Brief Narrative:  Megan Murillo is a 82 y.o. female with medical history significant of past medical history of essential hypertension that comes in for an episode of fall that happened on the day of admission the patient relates she was in the bathroom brushing her hair when her left foot gave out and she felt to the floor left hip first she did not hit her head after this she cannot bear weight she had to crawl back in bed in call her son. She relates she has pain with any type of movement of the left hip  Assessment & Plan:   Active Problems:   Closed left hip fracture (HCC)   Essential hypertension   Pre-op evaluation   Closed left hip fracture (HCC)/preop evaluation: -Resulted from a fall, appears to be mechanical, no loss of consciousness. -Orthopedics consulted, status post ORIF of left hip intertrochanteric fracture. -Lovenox for DVT prophylaxis. -Patient is controlled with opioids. -PT to evaluate, likely she will need to SNF on discharge.  Essential hypertension: -Hold antihypertensive medication can be recent in the morning once patient is able to take orals.   DVT prophylaxis: Subcutaneous Lovenox Code Status: Full Code Family Communication:  Disposition Plan:  Diet: Diet regular Room service appropriate? Yes; Fluid consistency: Thin  Consultants:   Ortho  Procedures:   None  Antimicrobials:   None  Objective: Vitals:   09/22/15 1713 09/22/15 2153 09/23/15 0014 09/23/15 0515  BP: (!) 137/55 (!) 130/52 (!) 143/54 (!) 143/56  Pulse: 79 88 91 74  Resp: 18     Temp: 97.7 F (36.5 C) 98.2 F (36.8 C)  98.2 F (36.8 C)  TempSrc: Oral Oral  Oral  SpO2: 93% 95% 95% 97%    Intake/Output Summary (Last 24 hours) at 09/23/15 1118 Last  data filed at 09/23/15 0726  Gross per 24 hour  Intake             1625 ml  Output             1850 ml  Net             -225 ml   There were no vitals filed for this visit.  Examination: General exam: Appears calm and comfortable  Respiratory system: Clear to auscultation. Respiratory effort normal. Cardiovascular system: S1 & S2 heard, RRR. No JVD, murmurs, rubs, gallops or clicks. No pedal edema. Gastrointestinal system: Abdomen is nondistended, soft and nontender. No organomegaly or masses felt. Normal bowel sounds heard. Central nervous system: Alert and oriented. No focal neurological deficits. Extremities: Symmetric 5 x 5 power. Skin: No rashes, lesions or ulcers Psychiatry: Judgement and insight appear normal. Mood & affect appropriate.   Data Reviewed: I have personally reviewed following labs and imaging studies  CBC:  Recent Labs Lab 09/21/15 1240 09/23/15 0437  WBC 11.0* 9.0  NEUTROABS 9.1*  --   HGB 12.8 9.8*  HCT 38.9 31.3*  MCV 91.5 92.6  PLT 209 137*   Basic Metabolic Panel:  Recent Labs Lab 09/21/15 1240 09/23/15 0437  NA 137 136  K 3.9 3.7  CL 104 104  CO2 26 25  GLUCOSE 110* 121*  BUN 21* 10  CREATININE 0.77 0.67  CALCIUM 9.4 8.7*   GFR: CrCl cannot be calculated (Unknown ideal weight.). Liver Function Tests: No results for input(s): AST,  ALT, ALKPHOS, BILITOT, PROT, ALBUMIN in the last 168 hours. No results for input(s): LIPASE, AMYLASE in the last 168 hours. No results for input(s): AMMONIA in the last 168 hours. Coagulation Profile:  Recent Labs Lab 09/21/15 1240  INR 1.00   Cardiac Enzymes: No results for input(s): CKTOTAL, CKMB, CKMBINDEX, TROPONINI in the last 168 hours. BNP (last 3 results) No results for input(s): PROBNP in the last 8760 hours. HbA1C: No results for input(s): HGBA1C in the last 72 hours. CBG: No results for input(s): GLUCAP in the last 168 hours. Lipid Profile: No results for input(s): CHOL, HDL, LDLCALC,  TRIG, CHOLHDL, LDLDIRECT in the last 72 hours. Thyroid Function Tests: No results for input(s): TSH, T4TOTAL, FREET4, T3FREE, THYROIDAB in the last 72 hours. Anemia Panel: No results for input(s): VITAMINB12, FOLATE, FERRITIN, TIBC, IRON, RETICCTPCT in the last 72 hours. Urine analysis:    Component Value Date/Time   COLORURINE YELLOW 09/22/2015 0521   APPEARANCEUR CLEAR 09/22/2015 0521   LABSPEC 1.018 09/22/2015 0521   PHURINE 5.0 09/22/2015 0521   GLUCOSEU NEGATIVE 09/22/2015 0521   HGBUR NEGATIVE 09/22/2015 0521   BILIRUBINUR NEGATIVE 09/22/2015 0521   KETONESUR NEGATIVE 09/22/2015 0521   PROTEINUR NEGATIVE 09/22/2015 0521   NITRITE NEGATIVE 09/22/2015 0521   LEUKOCYTESUR TRACE (A) 09/22/2015 0521   Sepsis Labs: @LABRCNTIP (procalcitonin:4,lacticidven:4)  ) Recent Results (from the past 240 hour(s))  MRSA PCR Screening     Status: None   Collection Time: 09/22/15 12:36 PM  Result Value Ref Range Status   MRSA by PCR NEGATIVE NEGATIVE Final    Comment:        The GeneXpert MRSA Assay (FDA approved for NASAL specimens only), is one component of a comprehensive MRSA colonization surveillance program. It is not intended to diagnose MRSA infection nor to guide or monitor treatment for MRSA infections.      Invalid input(s): PROCALCITONIN, LACTICACIDVEN   Radiology Studies: Dg C-arm 1-60 Min  Result Date: 09/22/2015 CLINICAL DATA:  Status post open reduction internal fixation of of left femoral intertrochanteric fracture EXAM: DG C-ARM 61-120 MIN; OPERATIVE LEFT HIP WITH PELVIS COMPARISON:  09/21/2015 FINDINGS: Five views of the left femur submitted. The patient is status post open reduction internal fixation of proximal left femoral fracture. There is a metallic fixation pin along the left femoral neck. A metallic fixation plate and metallic fixation screws are noted in proximal shaft of the left femur. There is anatomic alignment. IMPRESSION: Status post open reduction  internal fixation of inter trochanteric left femoral fracture. Metallic fixation material in proximal femur with anatomic alignment. Fluoroscopy time was 47 seconds.  Please see the operative report. Please note the films are not marked I assume is the left femur. Electronically Signed   By: Natasha Mead M.D.   On: 09/22/2015 16:14   Dg Hip Operative Unilat W Or W/o Pelvis Left  Result Date: 09/22/2015 CLINICAL DATA:  Status post open reduction internal fixation of of left femoral intertrochanteric fracture EXAM: DG C-ARM 61-120 MIN; OPERATIVE LEFT HIP WITH PELVIS COMPARISON:  09/21/2015 FINDINGS: Five views of the left femur submitted. The patient is status post open reduction internal fixation of proximal left femoral fracture. There is a metallic fixation pin along the left femoral neck. A metallic fixation plate and metallic fixation screws are noted in proximal shaft of the left femur. There is anatomic alignment. IMPRESSION: Status post open reduction internal fixation of inter trochanteric left femoral fracture. Metallic fixation material in proximal femur with anatomic alignment. Fluoroscopy time  was 47 seconds.  Please see the operative report. Please note the films are not marked I assume is the left femur. Electronically Signed   By: Natasha MeadLiviu  Pop M.D.   On: 09/22/2015 16:14   Dg Hip Unilat With Pelvis 2-3 Views Left  Result Date: 09/21/2015 CLINICAL DATA:  Fall today, left hip pain. EXAM: DG HIP (WITH OR WITHOUT PELVIS) 2-3V LEFT COMPARISON:  None. FINDINGS: There is a displaced/ comminuted fracture within the intratrochanteric region of the left femoral neck, with associated avulsion of the lesser trochanter. There is near 90 degrees angulation deformity at the fracture site. Femoral head remains well positioned relative to the acetabulum. No fracture seen within the adjacent osseous pelvis. Mild degenerative change noted in the lower lumbar spine. Soft tissues about the pelvis and left hip are  unremarkable. IMPRESSION: Acute displaced/comminuted fracture within the left femoral neck, intertrochanteric, with associated angulation deformity at the fracture site and avulsion of the lesser trochanter. Left femoral head remains well positioned relative to the acetabulum. No fracture seen within the adjacent osseous pelvis. Electronically Signed   By: Bary RichardStan  Maynard M.D.   On: 09/21/2015 13:49        Scheduled Meds: . aspirin EC  325 mg Oral BID WC  . oxybutynin  5 mg Oral Daily  . simvastatin  40 mg Oral q1800  . traZODone  100 mg Oral QHS  . triamterene-hydrochlorothiazide  1 tablet Oral Daily   Continuous Infusions: . sodium chloride 75 mL/hr at 09/22/15 1202  . dextrose 5 % and 0.45 % NaCl with KCl 20 mEq/L    . lactated ringers 50 mL/hr at 09/22/15 1312     LOS: 2 days    Time spent: 35 minutes    Tilton Marsalis A, MD [facility] Pager [phone]  If 7PM-7AM, please contact night-coverage [email or web address] Password [email or web address] 09/23/2015, 11:18 AM

## 2015-09-23 NOTE — Evaluation (Signed)
Physical Therapy Evaluation Patient Details Name: Megan MilchCallie Minter MRN: [id number] DOB: 1923/06/20 Today's Date: 09/23/2015   History of Present Illness  Pt is a 82 y/o female s/p ORIF for L hip fx secondary to fall. PMH including but not limited to HTN and vertigo.  Clinical Impression  Pt presented supine in bed with HOB elevated, awake and willing to participate in therapy session. Pt's son and sister were present throughout session. Pt required physical assist of mod A for bed mobility and max A x2 for transfer. Pt lives alone and prior to admission she was independent with all ADL's, IADL's and ambulated with use of a cane. PT recommending pt d/c to SNF. Pt would continue to benefit from skilled physical therapy services at this time while admitted and after d/c to address her below listed limitations in order to improve her overall safety and independence with functional mobility.      Follow Up Recommendations SNF;Supervision/Assistance - 24 hour    Equipment Recommendations  Other (comment) (defer to next venue)    Recommendations for Other Services       Precautions / Restrictions Precautions Precautions: Fall Restrictions Weight Bearing Restrictions: Yes LLE Weight Bearing: Partial weight bearing LLE Partial Weight Bearing Percentage or Pounds: 50%      Mobility  Bed Mobility Overal bed mobility: Needs Assistance Bed Mobility: Supine to Sit;Sit to Supine     Supine to sit: Mod assist;HOB elevated Sit to supine: Mod assist   General bed mobility comments: pt required mod A with bilateral LEs and upper body. pt utilizing bed rails to assist  Transfers Overall transfer level: Needs assistance Equipment used: Rolling walker (2 wheeled) Transfers: Sit to/from Stand Sit to Stand: Max assist;+2 physical assistance         General transfer comment: pt required VC'ing for bilateral hand positioning, max A to power up to achieve standing  Ambulation/Gait                 Stairs            Wheelchair Mobility    Modified Rankin (Stroke Patients Only)       Balance Overall balance assessment: Needs assistance Sitting-balance support: Feet supported;Bilateral upper extremity supported Sitting balance-Leahy Scale: Poor   Postural control: Posterior lean;Right lateral lean Standing balance support: Bilateral upper extremity supported Standing balance-Leahy Scale: Zero Standing balance comment: pt required max A x2 to achieve standing and only able to tolerate standing for approximately 15 seconds. pt also required max VC'ing for hand positioning and posture                             Pertinent Vitals/Pain Pain Assessment: Faces Faces Pain Scale: Hurts a little bit Pain Location: R hip Pain Descriptors / Indicators: Grimacing;Guarding Pain Intervention(s): Monitored during session;Repositioned    Home Living Family/patient expects to be discharged to:: Skilled nursing facility Living Arrangements: Alone                    Prior Function Level of Independence: Independent with assistive device(s)         Comments: pt using quad cane for ambulation. Pt stated she was independent with all ADLs.     Hand Dominance        Extremity/Trunk Assessment   Upper Extremity Assessment: Generalized weakness           Lower Extremity Assessment: LLE deficits/detail   LLE Deficits /  Details: Pt with decreased strength and ROM limitations secondary to post-op. Sensation grossly intact.     Communication   Communication: HOH  Cognition Arousal/Alertness: Awake/alert Behavior During Therapy: WFL for tasks assessed/performed;Anxious Overall Cognitive Status: Within Functional Limits for tasks assessed                      General Comments      Exercises Total Joint Exercises Ankle Circles/Pumps: AROM;Both;10 reps;Supine      Assessment/Plan    PT Assessment Patient needs continued PT  services  PT Diagnosis Difficulty walking   PT Problem List Decreased strength;Decreased range of motion;Decreased activity tolerance;Decreased balance;Decreased mobility;Decreased coordination;Decreased knowledge of use of DME;Pain  PT Treatment Interventions DME instruction;Gait training;Stair training;Functional mobility training;Balance training;Therapeutic activities;Therapeutic exercise;Neuromuscular re-education;Patient/family education   PT Goals (Current goals can be found in the Care Plan section) Acute Rehab PT Goals Patient Stated Goal: go to rehab PT Goal Formulation: With patient/family Time For Goal Achievement: 09/30/15 Potential to Achieve Goals: Fair    Frequency Min 5X/week   Barriers to discharge        Co-evaluation               End of Session Equipment Utilized During Treatment: Gait belt Activity Tolerance: Patient limited by pain;Patient limited by fatigue Patient left: in bed;with call bell/phone within reach;with family/visitor present Nurse Communication: Mobility status         Time: [id number] PT Time Calculation (min) (ACUTE ONLY): 20 min   Charges:   PT Evaluation $PT Eval Moderate Complexity: 1 Procedure     PT G CodesAlessandra Bevels:        [place] Foot M Marquavius Scaife 09/23/2015, 3:33 PM Deborah ChalkJennifer Kaprice Kage, PT, DPT [phone]

## 2015-09-23 NOTE — Progress Notes (Signed)
PT Cancellation Note  Patient Details Name: Megan Murillo MRN: [id number] DOB: [birth year]   Cancelled Treatment:    Reason Eval/Treat Not Completed: Other (comment). Pt with bed rest orders still in place. PT called pt's nurse to request d/c those orders if they are not accurate. Pt's nurse stated she would call the pt's doctor. Once bed rest orders have been d/c'd if appropriate to d/c, PT will f/u with pt at that time.   Alessandra BevelsJennifer M Yaacov Koziol 09/23/2015, 9:19 AM Deborah ChalkJennifer Kaizlee Carlino, PT, DPT [phone]

## 2015-09-23 NOTE — Progress Notes (Signed)
Subjective: 1 Day Post-Op Procedure(s) (LRB): ORIF INTERTROCHANTRIC HIP (Left) Patient reports pain as moderate. Taking by mouth and voiding okay.  Sister at bedside   Objective: Vital signs in last 24 hours: Temp:  [97.7 F (36.5 C)-98.2 F (36.8 C)] 98.2 F (36.8 C) (08/19 0515) Pulse Rate:  [74-91] 74 (08/19 0515) Resp:  [15-27] 18 (08/18 1713) BP: (119-143)/(52-67) 143/56 (08/19 0515) SpO2:  [92 %-99 %] 97 % (08/19 0515)  Intake/Output from previous day: 08/18 0701 - 08/19 0700 In: 1625 [I.V.:1625] Out: 2125 [Urine:2025; Blood:100] Intake/Output this shift: Total I/O In: -  Out: 400 [Urine:400]   Recent Labs  09/21/15 1240 09/23/15 0437  HGB 12.8 9.8*    Recent Labs  09/21/15 1240 09/23/15 0437  WBC 11.0* 9.0  RBC 4.25 3.38*  HCT 38.9 31.3*  PLT 209 137*    Recent Labs  09/21/15 1240 09/23/15 0437  NA 137 136  K 3.9 3.7  CL 104 104  CO2 26 25  BUN 21* 10  CREATININE 0.77 0.67  GLUCOSE 110* 121*  CALCIUM 9.4 8.7*    Recent Labs  09/21/15 1240  INR 1.00  Left hip exam:  Neurovascular intact Sensation intact distally Intact pulses distally Dorsiflexion/Plantar flexion intact Incision: dressing C/D/I Compartment soft  Assessment/Plan: 1 Day Post-Op Procedure(s) (LRB): ORIF INTERTROCHANTRIC HIP (Left)  Plan: Up with therapy Discharge to SNF in few days. Aspirin 325 mg twice daily for DVT prophylaxis. 50% partial weightbearing on left. We will follow. Megan Murillo G 09/23/2015, 11:44 AM

## 2015-09-23 NOTE — NC FL2 (Signed)
[place] MEDICAID FL2 LEVEL OF CARE SCREENING TOOL     IDENTIFICATION  Patient Name: Megan Murillo Birthdate: 20-Mar-1923 Sex: female Admission Date (Current Location): 09/21/2015  [facility] [place]   Facility and Address:  [facility], [address], [place], [place] [address]      Provider Number: [id number]  Attending Physician Name and Address:  Clydia LlanoMutaz Elmahi, MD  Relative Name and Phone Number:       Current Level of Care: Hospital Recommended Level of Care: Skilled Nursing Facility Prior Approval Number:    Date Approved/Denied:   PASRR Number:  [id number])  Discharge Plan: SNF    Current Diagnoses: Patient Active Problem List   Diagnosis Date Noted  . Closed left hip fracture (HCC) 09/21/2015  . Essential hypertension 09/21/2015  . Pre-op evaluation 09/21/2015    Orientation RESPIRATION BLADDER Height & Weight     Self, Time, Situation, Place  Normal Continent Weight:   Height:     BEHAVIORAL SYMPTOMS/MOOD NEUROLOGICAL BOWEL NUTRITION STATUS   (None)  (none) Continent Diet (regular)  AMBULATORY STATUS COMMUNICATION OF NEEDS Skin   Extensive Assist Verbally Wound Vac                       Personal Care Assistance Level of Assistance  Bathing, Feeding, Dressing Bathing Assistance: Limited assistance Feeding assistance: Independent Dressing Assistance: Limited assistance     Functional Limitations Info  Sight, Hearing, Speech Sight Info: Adequate Hearing Info: Adequate Speech Info: Adequate    SPECIAL CARE FACTORS FREQUENCY  PT (By licensed PT)     PT Frequency:  (5x/week)              Contractures Contractures Info: Not present    Additional Factors Info  Code Status, Allergies Code Status Info:  (full) Allergies Info:  (NKDA)           Current Medications (09/23/2015):  This is the current hospital active medication list Current Facility-Administered Medications  Medication  Dose Route Frequency Provider Last Rate Last Dose  . acetaminophen (TYLENOL) tablet 650 mg  650 mg Oral Q6H PRN Allena KatzEric K Phillips, PA-C       Or  . acetaminophen (TYLENOL) suppository 650 mg  650 mg Rectal Q6H PRN Allena KatzEric K Phillips, PA-C      . aspirin EC tablet 325 mg  325 mg Oral BID WC Allena KatzEric K Phillips, PA-C   325 mg at 09/23/15 1046  . fentaNYL (SUBLIMAZE) injection 25-50 mcg  25-50 mcg Intravenous Q5 min PRN Linton RumpJennifer Dickerson Allan, MD      . HYDROcodone-acetaminophen (NORCO/VICODIN) 5-325 MG per tablet 1-2 tablet  1-2 tablet Oral Q6H PRN Marinda ElkAbraham Feliz Ortiz, MD   2 tablet at 09/23/15 1521  . lactated ringers infusion   Intravenous Continuous Gean BirchwoodFrank Rowan, MD 50 mL/hr at 09/22/15 1312    . menthol-cetylpyridinium (CEPACOL) lozenge 3 mg  1 lozenge Oral PRN Allena KatzEric K Phillips, PA-C       Or  . phenol (CHLORASEPTIC) mouth spray 1 spray  1 spray Mouth/Throat PRN Allena KatzEric K Phillips, PA-C      . metoCLOPramide (REGLAN) tablet 5-10 mg  5-10 mg Oral Q8H PRN Allena KatzEric K Phillips, PA-C       Or  . metoCLOPramide (REGLAN) injection 5-10 mg  5-10 mg Intravenous Q8H PRN Allena KatzEric K Phillips, PA-C      . morphine 2 MG/ML injection 0.5 mg  0.5 mg Intravenous Q2H PRN Lambert KetoAbraham Feliz  Robb Matarrtiz, MD   0.5 mg at 09/21/15 2110  . ondansetron (ZOFRAN) injection 4 mg  4 mg Intravenous Once PRN Linton RumpJennifer Dickerson Allan, MD      . ondansetron [facility](ZOFRAN) tablet 4 mg  4 mg Oral Q6H PRN Allena KatzEric K Phillips, PA-C       Or  . ondansetron [facility](ZOFRAN) injection 4 mg  4 mg Intravenous Q6H PRN Allena KatzEric K Phillips, PA-C      . oxybutynin (DITROPAN-XL) 24 hr tablet 5 mg  5 mg Oral Daily Marinda ElkAbraham Feliz Ortiz, MD   5 mg at 09/23/15 1046  . polyethylene glycol (MIRALAX / GLYCOLAX) packet 17 g  17 g Oral Daily PRN Marinda ElkAbraham Feliz Ortiz, MD      . simvastatin (ZOCOR) tablet 40 mg  40 mg Oral q1800 Marinda ElkAbraham Feliz Ortiz, MD   40 mg at 09/22/15 2132  . traZODone (DESYREL) tablet 100 mg  100 mg Oral QHS Marinda ElkAbraham Feliz Ortiz, MD   100 mg at 09/22/15 2132  .  triamterene-hydrochlorothiazide (MAXZIDE-25) 37.5-25 MG per tablet 1 tablet  1 tablet Oral Daily Allena KatzEric K Phillips, PA-C   1 tablet at 09/23/15 1046     Discharge Medications: Please see discharge summary for a list of discharge medications.  Relevant Imaging Results:  Relevant Lab Results:   Additional Information SS#:[id number]  Terald Sleeperakiyah T Tayveon Lombardo, LCSW

## 2015-09-24 DIAGNOSIS — D62 Acute posthemorrhagic anemia: Secondary | ICD-10-CM

## 2015-09-24 LAB — BASIC METABOLIC PANEL
ANION GAP: 8 (ref 5–15)
BUN: 20 mg/dL (ref 6–20)
CO2: 29 mmol/L (ref 22–32)
Calcium: 8.7 mg/dL — ABNORMAL LOW (ref 8.9–10.3)
Chloride: 102 mmol/L (ref 101–111)
Creatinine, Ser: 0.89 mg/dL (ref 0.44–1.00)
GFR calc non Af Amer: 55 mL/min — ABNORMAL LOW (ref >60)
Glucose, Bld: 105 mg/dL — ABNORMAL HIGH (ref 65–99)
POTASSIUM: 3.5 mmol/L (ref 3.5–5.1)
SODIUM: 139 mmol/L (ref 135–145)

## 2015-09-24 LAB — CBC
HEMATOCRIT: 27.5 % — AB (ref 36.0–46.0)
HEMOGLOBIN: 8.9 g/dL — AB (ref 12.0–15.0)
MCH: 30.1 pg (ref 26.0–34.0)
MCHC: 32.4 g/dL (ref 30.0–36.0)
MCV: 92.9 fL (ref 78.0–100.0)
Platelets: 126 10*3/uL — ABNORMAL LOW (ref 150–400)
RBC: 2.96 MIL/uL — AB (ref 3.87–5.11)
RDW: 13.4 % (ref 11.5–15.5)
WBC: 8.3 10*3/uL (ref 4.0–10.5)

## 2015-09-24 NOTE — Progress Notes (Signed)
Subjective: 2 Days Post-Op Procedure(s) (LRB): ORIF INTERTROCHANTRIC HIP (Left) Patient reports pain as mild. No complaints today. Left hip is not particularly painful . Denies dizziness or lightheadedness.   Objective: Vital signs in last 24 hours: Temp:  [98.1 F (36.7 C)-98.6 F (37 C)] 98.6 F (37 C) (08/20 0540) Pulse Rate:  [70-86] 70 (08/20 0540) Resp:  [16] 16 (08/20 0540) BP: (117-139)/(44-58) 117/49 (08/20 0540) SpO2:  [92 %-96 %] 92 % (08/20 0540)  Intake/Output from previous day: 08/19 0701 - 08/20 0700 In: -  Out: 400 [Urine:400] Intake/Output this shift: No intake/output data recorded.   Recent Labs  09/21/15 1240 09/23/15 0437 09/24/15 0256  HGB 12.8 9.8* 8.9*    Recent Labs  09/23/15 0437 09/24/15 0256  WBC 9.0 8.3  RBC 3.38* 2.96*  HCT 31.3* 27.5*  PLT 137* 126*    Recent Labs  09/23/15 0437 09/24/15 0256  NA 136 139  K 3.7 3.5  CL 104 102  CO2 25 29  BUN 10 20  CREATININE 0.67 0.89  GLUCOSE 121* 105*  CALCIUM 8.7* 8.7*    Recent Labs  09/21/15 1240  INR 1.00  Left hip exam:  Neurovascular intact Sensation intact distally Intact pulses distally Dorsiflexion/Plantar flexion intact Incision: dressing C/D/I Compartment soft  Assessment/Plan: 2 Days Post-Op Procedure(s) (LRB): ORIF INTERTROCHANTRIC HIP (Left) Acute blood loss anemia, expected. Plan: Partial weightbearing 50% on left. Aspirin 325 mg twice daily 1 month for DVT prophylaxis. Up with therapy Discharge to SNF possibly tomorrow if medically stable.  Margalit Leece G 09/24/2015, 10:33 AM

## 2015-09-24 NOTE — Progress Notes (Signed)
PROGRESS NOTE  Megan Murillo  ZOX:[id number] DOB: [birth year] DOA: 09/21/2015 PCP: [name],[name], [name] Outpatient Specialists:  Subjective: Seen with her grand daughter at bedside, denies any complaints. Likely to skilled nursing facility in a.m.  Brief Narrative:  Megan Murillo is a 82 y.o. female with medical history significant of past medical history of essential hypertension that comes in for an episode of fall that happened on the day of admission the patient relates she was in the bathroom brushing her hair when her left foot gave out and she felt to the floor left hip first she did not hit her head after this she cannot bear weight she had to crawl back in bed in call her son. She relates she has pain with any type of movement of the left hip.   Assessment & Plan:   Active Problems:   Closed left hip fracture (HCC)   Essential hypertension   Pre-op evaluation   Postoperative anemia due to acute blood loss   Closed left hip fracture (HCC)/preop evaluation: -Resulted from a fall, appears to be mechanical, no loss of consciousness. -Orthopedics consulted, status post ORIF of left hip intertrochanteric fracture. -Lovenox for DVT prophylaxis. -Patient is controlled with opioids. -Likely to skilled nursing facility in a.m.  Essential hypertension: -Hold antihypertensive medication can be recent in the morning once patient is able to take orals.   DVT prophylaxis: Subcutaneous Lovenox Code Status: Full Code Family Communication:  Disposition Plan:  Diet: Diet regular Room service appropriate? Yes; Fluid consistency: Thin  Consultants:   Ortho  Procedures:   None  Antimicrobials:   None  Objective: Vitals:   09/23/15 0515 09/23/15 1505 09/23/15 1957 09/24/15 0540  BP: (!) 143/56 (!) 139/58 (!) 119/44 (!) 117/49  Pulse: 74 86 73 70  Resp:  16 16 16   Temp: 98.2 F (36.8 C) 98.4 F (36.9 C) 98.1 F (36.7 C) 98.6 F (37 C)  TempSrc: Oral Oral Oral Axillary    SpO2: 97% 96% 93% 92%   No intake or output data in the 24 hours ending 09/24/15 1043 There were no vitals filed for this visit.  Examination: General exam: Appears calm and comfortable  Respiratory system: Clear to auscultation. Respiratory effort normal. Cardiovascular system: S1 & S2 heard, RRR. No JVD, murmurs, rubs, gallops or clicks. No pedal edema. Gastrointestinal system: Abdomen is nondistended, soft and nontender. No organomegaly or masses felt. Normal bowel sounds heard. Central nervous system: Alert and oriented. No focal neurological deficits. Extremities: Symmetric 5 x 5 power. Skin: No rashes, lesions or ulcers Psychiatry: Judgement and insight appear normal. Mood & affect appropriate.   Data Reviewed: I have personally reviewed following labs and imaging studies  CBC:  Recent Labs Lab 09/21/15 1240 09/23/15 0437 09/24/15 0256  WBC 11.0* 9.0 8.3  NEUTROABS 9.1*  --   --   HGB 12.8 9.8* 8.9*  HCT 38.9 31.3* 27.5*  MCV 91.5 92.6 92.9  PLT 209 137* 126*   Basic Metabolic Panel:  Recent Labs Lab 09/21/15 1240 09/23/15 0437 09/24/15 0256  NA 137 136 139  K 3.9 3.7 3.5  CL 104 104 102  CO2 26 25 29   GLUCOSE 110* 121* 105*  BUN 21* 10 20  CREATININE 0.77 0.67 0.89  CALCIUM 9.4 8.7* 8.7*   GFR: CrCl cannot be calculated (Unknown ideal weight.). Liver Function Tests: No results for input(s): AST, ALT, ALKPHOS, BILITOT, PROT, ALBUMIN in the last 168 hours. No results for input(s): LIPASE, AMYLASE in the last 168 hours. No  results for input(s): AMMONIA in the last 168 hours. Coagulation Profile:  Recent Labs Lab 09/21/15 1240  INR 1.00   Cardiac Enzymes: No results for input(s): CKTOTAL, CKMB, CKMBINDEX, TROPONINI in the last 168 hours. BNP (last 3 results) No results for input(s): PROBNP in the last 8760 hours. HbA1C: No results for input(s): HGBA1C in the last 72 hours. CBG: No results for input(s): GLUCAP in the last 168 hours. Lipid  Profile: No results for input(s): CHOL, HDL, LDLCALC, TRIG, CHOLHDL, LDLDIRECT in the last 72 hours. Thyroid Function Tests: No results for input(s): TSH, T4TOTAL, FREET4, T3FREE, THYROIDAB in the last 72 hours. Anemia Panel: No results for input(s): VITAMINB12, FOLATE, FERRITIN, TIBC, IRON, RETICCTPCT in the last 72 hours. Urine analysis:    Component Value Date/Time   COLORURINE YELLOW 09/22/2015 0521   APPEARANCEUR CLEAR 09/22/2015 0521   LABSPEC 1.018 09/22/2015 0521   PHURINE 5.0 09/22/2015 0521   GLUCOSEU NEGATIVE 09/22/2015 0521   HGBUR NEGATIVE 09/22/2015 0521   BILIRUBINUR NEGATIVE 09/22/2015 0521   KETONESUR NEGATIVE 09/22/2015 0521   PROTEINUR NEGATIVE 09/22/2015 0521   NITRITE NEGATIVE 09/22/2015 0521   LEUKOCYTESUR TRACE (A) 09/22/2015 0521   Sepsis Labs: @LABRCNTIP (procalcitonin:4,lacticidven:4)  ) Recent Results (from the past 240 hour(s))  MRSA PCR Screening     Status: None   Collection Time: 09/22/15 12:36 PM  Result Value Ref Range Status   MRSA by PCR NEGATIVE NEGATIVE Final    Comment:        The GeneXpert MRSA Assay (FDA approved for NASAL specimens only), is one component of a comprehensive MRSA colonization surveillance program. It is not intended to diagnose MRSA infection nor to guide or monitor treatment for MRSA infections.      Invalid input(s): PROCALCITONIN, LACTICACIDVEN   Radiology Studies: Dg C-arm 1-60 Min  Result Date: 09/22/2015 CLINICAL DATA:  Status post open reduction internal fixation of of left femoral intertrochanteric fracture EXAM: DG C-ARM 61-120 MIN; OPERATIVE LEFT HIP WITH PELVIS COMPARISON:  09/21/2015 FINDINGS: Five views of the left femur submitted. The patient is status post open reduction internal fixation of proximal left femoral fracture. There is a metallic fixation pin along the left femoral neck. A metallic fixation plate and metallic fixation screws are noted in proximal shaft of the left femur. There is  anatomic alignment. IMPRESSION: Status post open reduction internal fixation of inter trochanteric left femoral fracture. Metallic fixation material in proximal femur with anatomic alignment. Fluoroscopy time was 47 seconds.  Please see the operative report. Please note the films are not marked I assume is the left femur. Electronically Signed   By: Natasha Mead M.D.   On: 09/22/2015 16:14   Dg Hip Operative Unilat W Or W/o Pelvis Left  Result Date: 09/22/2015 CLINICAL DATA:  Status post open reduction internal fixation of of left femoral intertrochanteric fracture EXAM: DG C-ARM 61-120 MIN; OPERATIVE LEFT HIP WITH PELVIS COMPARISON:  09/21/2015 FINDINGS: Five views of the left femur submitted. The patient is status post open reduction internal fixation of proximal left femoral fracture. There is a metallic fixation pin along the left femoral neck. A metallic fixation plate and metallic fixation screws are noted in proximal shaft of the left femur. There is anatomic alignment. IMPRESSION: Status post open reduction internal fixation of inter trochanteric left femoral fracture. Metallic fixation material in proximal femur with anatomic alignment. Fluoroscopy time was 47 seconds.  Please see the operative report. Please note the films are not marked I assume is the left femur.  Electronically Signed   By: Natasha MeadLiviu  Pop M.D.   On: 09/22/2015 16:14        Scheduled Meds: . aspirin EC  325 mg Oral BID WC  . oxybutynin  5 mg Oral Daily  . simvastatin  40 mg Oral q1800  . traZODone  100 mg Oral QHS  . triamterene-hydrochlorothiazide  1 tablet Oral Daily   Continuous Infusions: . lactated ringers 50 mL/hr at 09/22/15 1312     LOS: 3 days    Time spent: 35 minutes    Korbin Notaro A, MD [facility] Pager [phone]  If 7PM-7AM, please contact night-coverage [email or web address] Password [email or web address] 09/24/2015, 10:43 AM

## 2015-09-24 NOTE — Evaluation (Signed)
Occupational Therapy Evaluation Patient Details Name: Megan Murillo MRN: [id number] DOB: 1923/08/11 Today's Date: 09/24/2015    History of Present Illness Pt is a 82 y/o female s/p ORIF for L hip fx secondary to fall. PMH including but not limited to HTN and vertigo.   Clinical Impression   This 82 yo female admitted and underwent above presents to acute OT with deficits below (see OT problem list) thus affecting her PLOF of Mod I to Independent at home. She will benefit from acute OT with follow up at SNF to get back to PLOF    Follow Up Recommendations  SNF    Equipment Recommendations  Other (comment) (TBD at SNF)       Precautions / Restrictions Precautions Precautions: Fall Restrictions Weight Bearing Restrictions: Yes LLE Weight Bearing: Partial weight bearing LLE Partial Weight Bearing Percentage or Pounds: 50%; has a difficult time with this      Mobility Bed Mobility Overal bed mobility: Needs Assistance Bed Mobility: Supine to Sit     Supine to sit: Mod assist;HOB elevated        Transfers Overall transfer level: Needs assistance Equipment used: Rolling walker (2 wheeled) Transfers: Sit to/from UGI CorporationStand;Stand Pivot Transfers Sit to Stand: Mod assist Stand pivot transfers: Mod assist;+2 physical assistance       General transfer comment: requires constant VCs for sequencing of steps and safe placement of hands; great difficulty maintaining 50% WB'ing    Balance Overall balance assessment: Needs assistance Sitting-balance support: Feet supported;Bilateral upper extremity supported Sitting balance-Leahy Scale: Poor Sitting balance - Comments: reliant on Bil UE support to maintain sitting balance with tendency to lean posteriorly and to right Postural control: Posterior lean;Right lateral lean Standing balance support: Bilateral upper extremity supported Standing balance-Leahy Scale: Poor Standing balance comment: Mod A sit<>stand and Mod A +2 stand turn  to her right                            ADL   Eating/Feeding: Supervision/ safety;Set up;Sitting (in recliner)   Grooming: Supervision/safety;Set up;Sitting (in recliner)   Upper Body Bathing: Supervision/ safety;Set up;Sitting (in recliner)   Lower Body Bathing: Total assistance (with addtional person to A in standing (ModA))   Upper Body Dressing : Minimal assistance;Sitting (in recliner)   Lower Body Dressing: Total assistance (with addtional person to A in standing (ModA))   Toilet Transfer: +2 for physical assistance;Moderate assistance;Stand-pivot (bed (raised) to recliner on her right)   Toileting- Clothing Manipulation and Hygiene: Total assistance (with addtional person to A in standing (ModA))                         Pertinent Vitals/Pain Pain Assessment: Faces Faces Pain Scale: Hurts little more Pain Location: left hip Pain Descriptors / Indicators: Guarding;Grimacing;Sore Pain Intervention(s): Monitored during session;Repositioned     Hand Dominance     Extremity/Trunk Assessment Upper Extremity Assessment Upper Extremity Assessment: Generalized weakness   Lower Extremity Assessment Lower Extremity Assessment: Defer to PT evaluation          Cognition Arousal/Alertness: Awake/alert Behavior During Therapy: WFL for tasks assessed/performed Overall Cognitive Status: Within Functional Limits for tasks assessed (pt did have trouble following commands for where to put her hands for transfers)                                Home  Living Family/patient expects to be discharged to:: Skilled nursing facility                                             OT Diagnosis: Generalized weakness;Acute pain   OT Problem List: Decreased strength;Decreased range of motion;Decreased activity tolerance;Impaired balance (sitting and/or standing);Decreased safety awareness;Decreased knowledge of use of DME or AE;Decreased  knowledge of precautions;Pain   OT Treatment/Interventions: Self-care/ADL training;Balance training;DME and/or AE instruction;Patient/family education    OT Goals(Current goals can be found in the care plan section) Acute Rehab OT Goals Patient Stated Goal: to go to rehab and then home OT Goal Formulation: With patient Time For Goal Achievement: 10/01/15 Potential to Achieve Goals: Fair  OT Frequency: Min 2X/week   Barriers to D/C: Decreased caregiver support             End of Session Equipment Utilized During Treatment: Gait belt;Rolling walker Nurse Communication: Mobility status (NT advised as well (+2 Mod A, stand turn with RW going to pt's right, use gait belt)--I put a gait belt in pt's room)  Activity Tolerance: Patient limited by fatigue Patient left: in chair;with call bell/phone within reach;with chair alarm set   Time: 1250-1313 OT Time Calculation (min): 23 min Charges:  OT General Charges $OT Visit: 1 Procedure OT Evaluation $OT Eval Moderate Complexity: 1 Procedure OT Treatments $Self Care/Home Management : 8-22 mins  Evette GeorgesLeonard, Delmore Sear Eva [phone] 09/24/2015, 1:37 PM

## 2015-09-25 ENCOUNTER — Encounter (HOSPITAL_COMMUNITY): Payer: Self-pay | Admitting: Orthopedic Surgery

## 2015-09-25 LAB — CBC
HCT: 29.8 % — ABNORMAL LOW (ref 36.0–46.0)
HEMOGLOBIN: 9.4 g/dL — AB (ref 12.0–15.0)
MCH: 29.5 pg (ref 26.0–34.0)
MCHC: 31.5 g/dL (ref 30.0–36.0)
MCV: 93.4 fL (ref 78.0–100.0)
PLATELETS: 132 10*3/uL — AB (ref 150–400)
RBC: 3.19 MIL/uL — AB (ref 3.87–5.11)
RDW: 13.3 % (ref 11.5–15.5)
WBC: 6.6 10*3/uL (ref 4.0–10.5)

## 2015-09-25 NOTE — Progress Notes (Signed)
Patient has been accepted to [facility]. Patient is aware. Family is aware.  The required documents to obtain insurance authorization number has been sent. SW currently waiting on authorization number.  Crista CurbBrittney Sita Mangen, Social Worker [phone]

## 2015-09-25 NOTE — Progress Notes (Signed)
Physical Therapy Treatment Patient Details Name: Megan MilchCallie Murillo MRN: [id number] DOB: 09-19-1923 Today's Date: 09/25/2015    History of Present Illness Pt is a 82 y/o female s/p ORIF for L hip fx secondary to fall. PMH including but not limited to HTN and vertigo.    PT Comments    Pt performed improved transfer with use of stedy frame.  Pt remains to present with decreased hip extension in standing, as standing posture improves and standing tolerance improves patient will be ready to progress to gait training.  Will continue to provided skilled rehab in acute setting in prep for d/c to post acute setting.    Follow Up Recommendations  SNF;Supervision/Assistance - 24 hour     Equipment Recommendations  Other (comment) (defer to next venue.  )    Recommendations for Other Services       Precautions / Restrictions Precautions Precautions: Fall Restrictions Weight Bearing Restrictions: Yes LLE Weight Bearing: Partial weight bearing LLE Partial Weight Bearing Percentage or Pounds: 50%    Mobility  Bed Mobility Overal bed mobility: Needs Assistance Bed Mobility: Supine to Sit     Supine to sit: Mod assist     General bed mobility comments: Pt required assist with B LEs to edge of bed and for trunk elevation.  Pt able to follow commands well and moving with improved ease from previous session.    Transfers Overall transfer level: Needs assistance Equipment used: None     Stand pivot transfers: Mod assist       General transfer comment: Pt required cues for hand on stedy frame, assist to boost into standing and assist for B hip extension.  Pt able to maintain PWB during sit to stand.  Pt performed sit to stand from bed to stedy, stedy to [facility], BSC to stedy, and stedy to recliner chair.  NT assisted patient in scooting back in recliner with +2 total assist for scooting to improve positioning.    Ambulation/Gait Ambulation/Gait assistance:  (unable at this time.  )               Stairs            Wheelchair Mobility    Modified Rankin (Stroke Patients Only)       Balance Overall balance assessment: Needs assistance   Sitting balance-Leahy Scale: Poor Sitting balance - Comments: Remains to lean posterior and to the right.       Standing balance-Leahy Scale: Poor Standing balance comment: with heaby reliance on stedy frame cross bar.                      Cognition Arousal/Alertness: Awake/alert Behavior During Therapy: WFL for tasks assessed/performed Overall Cognitive Status: Within Functional Limits for tasks assessed                      Exercises      General Comments        Pertinent Vitals/Pain Pain Assessment: 0-10 Faces Pain Scale: Hurts a little bit Pain Location: L hip with mobility.   Pain Descriptors / Indicators: Grimacing;Guarding;Sore Pain Intervention(s): Monitored during session;Repositioned;Other (comment)    Home Living                      Prior Function            PT Goals (current goals can now be found in the care plan section) Acute Rehab PT Goals Patient Stated Goal:  to go to rehab and then home Potential to Achieve Goals: Fair Progress towards PT goals: Progressing toward goals    Frequency  Min 5X/week    PT Plan Current plan remains appropriate    Co-evaluation             End of Session   Activity Tolerance: Patient limited by pain;Patient limited by fatigue Patient left: with call bell/phone within reach;with family/visitor present;in chair     Time: 0937-1000 PT Time Calculation (min) (ACUTE ONLY): 23 min  Charges:  $Therapeutic Activity: 23-37 mins                    G Codes:      Lear Carstens J Chevis Weisensel 09/25/2015, Florestine Avers10:06 AM  Joycelyn RuaAimee Daemien Fronczak, PTA pager [phone]

## 2015-09-25 NOTE — Care Management Important Message (Signed)
Important Message  Patient Details  Name: Megan Murillo MRN: [id number] Date of Birth: Jan 02, 1924   Medicare Important Message Given:  Yes    Dvora Buitron 09/25/2015, 12:00 PM

## 2015-09-25 NOTE — Progress Notes (Signed)
PATIENT IDOretha Milch: Megan Murillo  MRN: [id number]  DOB/AGE:  80-28-1925 / 82 y.o.  3 Days Post-Op Procedure(s) (LRB): ORIF INTERTROCHANTRIC HIP (Left)    PROGRESS NOTE Subjective: Patient is alert, oriented, no Nausea, no Vomiting, yes passing gas, . Taking PO well. Denies SOB, Chest or Calf Pain. Using Incentive Spirometer, PAS in place. Ambulate 50% weight bearing to the left leg. Patient reports pain as  mild  .    Objective: Vital signs in last 24 hours: Vitals:   09/24/15 1425 09/24/15 2008 09/25/15 0401 09/25/15 1032  BP: (!) 116/52 (!) 115/52 (!) 145/54 106/75  Pulse: 73 67 72 75  Resp: 16 16 16 17   Temp: 98.5 F (36.9 C) 98.3 F (36.8 C) 98 F (36.7 C) 98.2 F (36.8 C)  TempSrc: Oral Oral Oral Oral  SpO2: 93% 95% 98% 95%      Intake/Output from previous day: No intake/output data recorded.   Intake/Output this shift: No intake/output data recorded.   LABORATORY DATA:  Recent Labs  09/23/15 0437 09/24/15 0256 09/25/15 0631  WBC 9.0 8.3 6.6  HGB 9.8* 8.9* 9.4*  HCT 31.3* 27.5* 29.8*  PLT 137* 126* 132*  NA 136 139  --   K 3.7 3.5  --   CL 104 102  --   CO2 25 29  --   BUN 10 20  --   CREATININE 0.67 0.89  --   GLUCOSE 121* 105*  --   CALCIUM 8.7* 8.7*  --     Examination: Neurologically intact Neurovascular intact Sensation intact distally Intact pulses distally Dorsiflexion/Plantar flexion intact Incision: dressing C/D/I No cellulitis present Compartment soft} XR AP&Lat of hip shows well placed\fixed THA  Assessment:   3 Days Post-Op Procedure(s) (LRB): ORIF INTERTROCHANTRIC HIP (Left) ADDITIONAL DIAGNOSIS:  Expected Acute Blood Loss Anemia   Plan: PT/OT WBAT, THA  DVT Prophylaxis: SCDx72 hrs, ASA 325 mg BID x 2 weeks  DISCHARGE PLAN: Skilled Nursing Facility/Rehab, [facility] when bed available  DISCHARGE NEEDS: HHPT, HHRN, Walker and 3-in-1 comode seat

## 2015-09-25 NOTE — Discharge Summary (Addendum)
Physician Discharge Summary  [facility] ZOX:[id number] DOB: 1923-03-16 DOA: 09/21/2015  PCP: Elizabeth Palau, [name]  Admit date: 09/21/2015 Discharge date: 09/25/2015  Admitted From: Home Disposition: SNF for ST rehabilitation  Recommendations for Outpatient Follow-up:  1. Follow up with PCP in 1-2 weeks 2. Please obtain BMP/CBC in one week 3. Aspirin 325 mg twice a day for one more month for DVT prophylaxis  Home Health: N/A Equipment/Devices: N/A  Discharge Condition:*Stable CODE STATUS:*Full code Diet recommendation: Heart healthy diet  Brief/Interim Summary: Megan McHoneis a 82 y.o.femalewith medical history significant of past medical history of essential hypertension that comes in for an episode of fall that happened on the day of admission the patient relates she was in the bathroom brushing her hair when her left foot gave out and she felt to the floor left hip first she did not hit her head after this she cannot bear weight she had to crawl back in bed in call her son. She relates she has pain with any type of movement of the left hip.   Discharge Diagnoses:  Active Problems:   Closed left hip fracture (HCC)   Essential hypertension   Pre-op evaluation   Postoperative anemia due to acute blood loss   Closed left hip fracture (HCC)/preop evaluation: -Resulted from a fall, appears to be mechanical, no loss of consciousness. -Orthopedics consulted, status post ORIF of left hip intertrochanteric fracture. -Pain is controlled with opioids, discharged on Vicodin as needed (prescribed by orthopedics). -DVT prophylaxis utilized Lovenox while she is in the hospital, on discharge orthopedics recommended aspirin twice a day for one month.  Essential hypertension: -Blood pressure with reasonable control, restarted meds on discharge.  Acute blood loss anemia  -Postoperative anemia secondary to acute blood loss, patient hemoglobin baseline is 12.8. -Hemoglobin all way down  to 8.9, on discharge 9.4. This is likely to perioperative blood loss. -No transfusion, check CBC within one week.  Dyslipidemia -Restart simvastatin on discharge.  Discharge Instructions  Discharge Instructions    Diet - low sodium heart healthy    Complete by:  As directed   Increase activity slowly    Complete by:  As directed   Partial weight bearing    Complete by:  As directed   % Body Weight:  50%   Laterality:  left   Extremity:  Lower       Medication List    STOP taking these medications   meloxicam 7.5 MG tablet Commonly known as:  MOBIC     TAKE these medications   aspirin EC 325 MG tablet Take 1 tablet (325 mg total) by mouth 2 (two) times daily.   HYDROcodone-acetaminophen 5-325 MG tablet Commonly known as:  NORCO Take 1 tablet by mouth every 6 (six) hours as needed.   methocarbamol 500 MG tablet Commonly known as:  ROBAXIN Take 1 tablet (500 mg total) by mouth 2 (two) times daily with a meal.   oxybutynin 5 MG 24 hr tablet Commonly known as:  DITROPAN-XL Take 5 mg by mouth daily.   simvastatin 40 MG tablet Commonly known as:  ZOCOR Take 40 mg by mouth daily.   traZODone 100 MG tablet Commonly known as:  DESYREL Take 100 mg by mouth at bedtime.   triamterene-hydrochlorothiazide 37.5-25 MG tablet Commonly known as:  MAXZIDE-25 Take 1 tablet by mouth daily.      Follow-up Information    Nestor Lewandowsky, MD Follow up in 2 week(s).   Specialty:  Orthopedic Surgery Contact information: [address]  [place] [id number] [phone]          No Known Allergies  Consultations:  Turner Danielsowan, ortho   Procedures/Studies: Dg C-arm 1-60 Min  Result Date: 09/22/2015 CLINICAL DATA:  Status post open reduction internal fixation of of left femoral intertrochanteric fracture EXAM: DG C-ARM 61-120 MIN; OPERATIVE LEFT HIP WITH PELVIS COMPARISON:  09/21/2015 FINDINGS: Five views of the left femur submitted. The patient is status post open reduction  internal fixation of proximal left femoral fracture. There is a metallic fixation pin along the left femoral neck. A metallic fixation plate and metallic fixation screws are noted in proximal shaft of the left femur. There is anatomic alignment. IMPRESSION: Status post open reduction internal fixation of inter trochanteric left femoral fracture. Metallic fixation material in proximal femur with anatomic alignment. Fluoroscopy time was 47 seconds.  Please see the operative report. Please note the films are not marked I assume is the left femur. Electronically Signed   By: Natasha MeadLiviu  Pop M.D.   On: 09/22/2015 16:14   Dg Hip Operative Unilat W Or W/o Pelvis Left  Result Date: 09/22/2015 CLINICAL DATA:  Status post open reduction internal fixation of of left femoral intertrochanteric fracture EXAM: DG C-ARM 61-120 MIN; OPERATIVE LEFT HIP WITH PELVIS COMPARISON:  09/21/2015 FINDINGS: Five views of the left femur submitted. The patient is status post open reduction internal fixation of proximal left femoral fracture. There is a metallic fixation pin along the left femoral neck. A metallic fixation plate and metallic fixation screws are noted in proximal shaft of the left femur. There is anatomic alignment. IMPRESSION: Status post open reduction internal fixation of inter trochanteric left femoral fracture. Metallic fixation material in proximal femur with anatomic alignment. Fluoroscopy time was 47 seconds.  Please see the operative report. Please note the films are not marked I assume is the left femur. Electronically Signed   By: Natasha MeadLiviu  Pop M.D.   On: 09/22/2015 16:14   Dg Hip Unilat With Pelvis 2-3 Views Left  Result Date: 09/21/2015 CLINICAL DATA:  Fall today, left hip pain. EXAM: DG HIP (WITH OR WITHOUT PELVIS) 2-3V LEFT COMPARISON:  None. FINDINGS: There is a displaced/ comminuted fracture within the intratrochanteric region of the left femoral neck, with associated avulsion of the lesser trochanter. There is  near 90 degrees angulation deformity at the fracture site. Femoral head remains well positioned relative to the acetabulum. No fracture seen within the adjacent osseous pelvis. Mild degenerative change noted in the lower lumbar spine. Soft tissues about the pelvis and left hip are unremarkable. IMPRESSION: Acute displaced/comminuted fracture within the left femoral neck, intertrochanteric, with associated angulation deformity at the fracture site and avulsion of the lesser trochanter. Left femoral head remains well positioned relative to the acetabulum. No fracture seen within the adjacent osseous pelvis. Electronically Signed   By: Bary RichardStan  Maynard M.D.   On: 09/21/2015 13:49    (Echo, Carotid, EGD, Colonoscopy, ERCP)    Subjective:   Discharge Exam: Vitals:   09/24/15 2008 09/25/15 0401  BP: (!) 115/52 (!) 145/54  Pulse: 67 72  Resp: 16 16  Temp: 98.3 F (36.8 C) 98 F (36.7 C)   Vitals:   09/24/15 0540 09/24/15 1425 09/24/15 2008 09/25/15 0401  BP: (!) 117/49 (!) 116/52 (!) 115/52 (!) 145/54  Pulse: 70 73 67 72  Resp: 16 16 16 16   Temp: 98.6 F (37 C) 98.5 F (36.9 C) 98.3 F (36.8 C) 98 F (36.7 C)  TempSrc: Axillary Oral Oral Oral  SpO2: 92% 93% 95% 98%    General: Pt is alert, awake, not in acute distress Cardiovascular: RRR, S1/S2 +, no rubs, no gallops Respiratory: CTA bilaterally, no wheezing, no rhonchi Abdominal: Soft, NT, ND, bowel sounds + Extremities: no edema, no cyanosis    The results of significant diagnostics from this hospitalization (including imaging, microbiology, ancillary and laboratory) are listed below for reference.     Microbiology: Recent Results (from the past 240 hour(s))  MRSA PCR Screening     Status: None   Collection Time: 09/22/15 12:36 PM  Result Value Ref Range Status   MRSA by PCR NEGATIVE NEGATIVE Final    Comment:        The GeneXpert MRSA Assay (FDA approved for NASAL specimens only), is one component of a comprehensive  MRSA colonization surveillance program. It is not intended to diagnose MRSA infection nor to guide or monitor treatment for MRSA infections.      Labs: BNP (last 3 results) No results for input(s): BNP in the last 8760 hours. Basic Metabolic Panel:  Recent Labs Lab 09/21/15 1240 09/23/15 0437 09/24/15 0256  NA 137 136 139  K 3.9 3.7 3.5  CL 104 104 102  CO2 26 25 29   GLUCOSE 110* 121* 105*  BUN 21* 10 20  CREATININE 0.77 0.67 0.89  CALCIUM 9.4 8.7* 8.7*   Liver Function Tests: No results for input(s): AST, ALT, ALKPHOS, BILITOT, PROT, ALBUMIN in the last 168 hours. No results for input(s): LIPASE, AMYLASE in the last 168 hours. No results for input(s): AMMONIA in the last 168 hours. CBC:  Recent Labs Lab 09/21/15 1240 09/23/15 0437 09/24/15 0256 09/25/15 0631  WBC 11.0* 9.0 8.3 6.6  NEUTROABS 9.1*  --   --   --   HGB 12.8 9.8* 8.9* 9.4*  HCT 38.9 31.3* 27.5* 29.8*  MCV 91.5 92.6 92.9 93.4  PLT 209 137* 126* 132*   Cardiac Enzymes: No results for input(s): CKTOTAL, CKMB, CKMBINDEX, TROPONINI in the last 168 hours. BNP: Invalid input(s): POCBNP CBG: No results for input(s): GLUCAP in the last 168 hours. D-Dimer No results for input(s): DDIMER in the last 72 hours. Hgb A1c No results for input(s): HGBA1C in the last 72 hours. Lipid Profile No results for input(s): CHOL, HDL, LDLCALC, TRIG, CHOLHDL, LDLDIRECT in the last 72 hours. Thyroid function studies No results for input(s): TSH, T4TOTAL, T3FREE, THYROIDAB in the last 72 hours.  Invalid input(s): FREET3 Anemia work up No results for input(s): VITAMINB12, FOLATE, FERRITIN, TIBC, IRON, RETICCTPCT in the last 72 hours. Urinalysis    Component Value Date/Time   COLORURINE YELLOW 09/22/2015 0521   APPEARANCEUR CLEAR 09/22/2015 0521   LABSPEC 1.018 09/22/2015 0521   PHURINE 5.0 09/22/2015 0521   GLUCOSEU NEGATIVE 09/22/2015 0521   HGBUR NEGATIVE 09/22/2015 0521   BILIRUBINUR NEGATIVE 09/22/2015  0521   KETONESUR NEGATIVE 09/22/2015 0521   PROTEINUR NEGATIVE 09/22/2015 0521   NITRITE NEGATIVE 09/22/2015 0521   LEUKOCYTESUR TRACE (A) 09/22/2015 0521   Sepsis Labs Invalid input(s): PROCALCITONIN,  WBC,  LACTICIDVEN Microbiology Recent Results (from the past 240 hour(s))  MRSA PCR Screening     Status: None   Collection Time: 09/22/15 12:36 PM  Result Value Ref Range Status   MRSA by PCR NEGATIVE NEGATIVE Final    Comment:        The GeneXpert MRSA Assay (FDA approved for NASAL specimens only), is one component of a comprehensive MRSA colonization surveillance program. It is not intended to diagnose MRSA  infection nor to guide or monitor treatment for MRSA infections.      Time coordinating discharge: Over 30 minutes  SIGNED:   Clint LippsELMAHI,Ronell Duffus A, MD  [facility] 09/25/2015, 9:55 AM Pager   If 7PM-7AM, please contact night-coverage [email or web address] Password [email or web address]

## 2015-09-26 MED ORDER — MAGNESIUM HYDROXIDE 400 MG/5ML PO SUSP
30.0000 mL | Freq: Once | ORAL | Status: AC
  Administered 2015-09-26: 30 mL via ORAL
  Filled 2015-09-26: qty 30

## 2015-09-26 NOTE — Progress Notes (Signed)
Pt stable for d/c to SNF today per MD. [facility], all questions answered. Pt was given milk of magnesia prior to d/c-MD aware of LBM. Telemetry was d/c, belongings packed and sent with pt's sister. Pt transported to facility via [facility].   [place], Latricia HeftKorie G

## 2015-09-26 NOTE — Progress Notes (Signed)
PROGRESS NOTE  Megan Murillo  UEA:[id number] DOB: November 26, 1923 DOA: 09/21/2015 PCP: [name],[name], [name] Outpatient Specialists:  Subjective: Seen with her sister at bedside, discharge was delayed from yesterday because of insurance authorization. No changes clinically, [facility]  Brief Narrative:  Megan Murillo is a 82 y.o. female with medical history significant of past medical history of essential hypertension that comes in for an episode of fall that happened on the day of admission the patient relates she was in the bathroom brushing her hair when her left foot gave out and she felt to the floor left hip first she did not hit her head after this she cannot bear weight she had to crawl back in bed in call her son. She relates she has pain with any type of movement of the left hip.   Assessment & Plan:   Active Problems:   Closed left hip fracture (HCC)   Essential hypertension   Pre-op evaluation   Postoperative anemia due to acute blood loss   Closed left hip fracture (HCC)/preop evaluation: -Resulted from a fall, appears to be mechanical, no loss of consciousness. -Orthopedics consulted, status post ORIF of left hip intertrochanteric fracture. -Aspirin for DVT prophylaxis opioid for pain control. To SNF today.  Essential hypertension: -Reasonable control.   DVT prophylaxis: Subcutaneous Lovenox Code Status: Full Code Family Communication:  Disposition Plan: SNF Diet: Diet - low sodium heart healthy Diet Heart Room service appropriate? Yes; Fluid consistency: Thin  Consultants:   Ortho  Procedures:   None  Antimicrobials:   None  Objective: Vitals:   09/25/15 1505 09/25/15 1850 09/25/15 2021 09/26/15 0444  BP: 122/63 (!) 137/108 129/88 115/78  Pulse: 67 67 69 74  Resp: 17 17 16 16   Temp: 98.1 F (36.7 C) 98.9 F (37.2 C) 98.5 F (36.9 C) 97.6 F (36.4 C)  TempSrc: Oral Oral Oral Oral  SpO2: 96% 96% 98% 97%   No intake or output data in the  24 hours ending 09/26/15 1006 There were no vitals filed for this visit.  Examination: General exam: Appears calm and comfortable  Respiratory system: Clear to auscultation. Respiratory effort normal. Cardiovascular system: S1 & S2 heard, RRR. No JVD, murmurs, rubs, gallops or clicks. No pedal edema. Gastrointestinal system: Abdomen is nondistended, soft and nontender. No organomegaly or masses felt. Normal bowel sounds heard. Central nervous system: Alert and oriented. No focal neurological deficits. Extremities: Symmetric 5 x 5 power. Skin: No rashes, lesions or ulcers Psychiatry: Judgement and insight appear normal. Mood & affect appropriate.   Data Reviewed: I have personally reviewed following labs and imaging studies  CBC:  Recent Labs Lab 09/21/15 1240 09/23/15 0437 09/24/15 0256 09/25/15 0631  WBC 11.0* 9.0 8.3 6.6  NEUTROABS 9.1*  --   --   --   HGB 12.8 9.8* 8.9* 9.4*  HCT 38.9 31.3* 27.5* 29.8*  MCV 91.5 92.6 92.9 93.4  PLT 209 137* 126* 132*   Basic Metabolic Panel:  Recent Labs Lab 09/21/15 1240 09/23/15 0437 09/24/15 0256  NA 137 136 139  K 3.9 3.7 3.5  CL 104 104 102  CO2 26 25 29   GLUCOSE 110* 121* 105*  BUN 21* 10 20  CREATININE 0.77 0.67 0.89  CALCIUM 9.4 8.7* 8.7*   GFR: CrCl cannot be calculated (Unknown ideal weight.). Liver Function Tests: No results for input(s): AST, ALT, ALKPHOS, BILITOT, PROT, ALBUMIN in the last 168 hours. No results for input(s): LIPASE, AMYLASE in the last 168 hours. No results for input(s):  AMMONIA in the last 168 hours. Coagulation Profile:  Recent Labs Lab 09/21/15 1240  INR 1.00   Cardiac Enzymes: No results for input(s): CKTOTAL, CKMB, CKMBINDEX, TROPONINI in the last 168 hours. BNP (last 3 results) No results for input(s): PROBNP in the last 8760 hours. HbA1C: No results for input(s): HGBA1C in the last 72 hours. CBG: No results for input(s): GLUCAP in the last 168 hours. Lipid Profile: No results  for input(s): CHOL, HDL, LDLCALC, TRIG, CHOLHDL, LDLDIRECT in the last 72 hours. Thyroid Function Tests: No results for input(s): TSH, T4TOTAL, FREET4, T3FREE, THYROIDAB in the last 72 hours. Anemia Panel: No results for input(s): VITAMINB12, FOLATE, FERRITIN, TIBC, IRON, RETICCTPCT in the last 72 hours. Urine analysis:    Component Value Date/Time   COLORURINE YELLOW 09/22/2015 0521   APPEARANCEUR CLEAR 09/22/2015 0521   LABSPEC 1.018 09/22/2015 0521   PHURINE 5.0 09/22/2015 0521   GLUCOSEU NEGATIVE 09/22/2015 0521   HGBUR NEGATIVE 09/22/2015 0521   BILIRUBINUR NEGATIVE 09/22/2015 0521   KETONESUR NEGATIVE 09/22/2015 0521   PROTEINUR NEGATIVE 09/22/2015 0521   NITRITE NEGATIVE 09/22/2015 0521   LEUKOCYTESUR TRACE (A) 09/22/2015 0521   Sepsis Labs: @LABRCNTIP (procalcitonin:4,lacticidven:4)  ) Recent Results (from the past 240 hour(s))  MRSA PCR Screening     Status: None   Collection Time: 09/22/15 12:36 PM  Result Value Ref Range Status   MRSA by PCR NEGATIVE NEGATIVE Final    Comment:        The GeneXpert MRSA Assay (FDA approved for NASAL specimens only), is one component of a comprehensive MRSA colonization surveillance program. It is not intended to diagnose MRSA infection nor to guide or monitor treatment for MRSA infections.      Invalid input(s): PROCALCITONIN, LACTICACIDVEN   Radiology Studies: No results found.      Scheduled Meds: . aspirin EC  325 mg Oral BID WC  . oxybutynin  5 mg Oral Daily  . simvastatin  40 mg Oral q1800  . traZODone  100 mg Oral QHS  . triamterene-hydrochlorothiazide  1 tablet Oral Daily   Continuous Infusions: . lactated ringers 50 mL/hr at 09/22/15 1312     LOS: 5 days    Time spent: 35 minutes    Megan Murillo A, MD [facility] Pager [phone]  If 7PM-7AM, please contact night-coverage [email or web address] Password [email or web address] 09/26/2015, 10:06 AM

## 2015-09-26 NOTE — Progress Notes (Signed)
PATIENT IDOretha Milch: Megan Murillo  MRN: [id number]  DOB/AGE:  06-05-1923 / 82 y.o.  4 Days Post-Op Procedure(s) (LRB): ORIF INTERTROCHANTRIC HIP (Left)    PROGRESS NOTE Subjective: Patient is alert, oriented, no Nausea, no Vomiting, yes passing gas, . Taking PO well. Denies SOB, Chest or Calf Pain. Using Incentive Spirometer, PAS in place. Ambulate 50% LLE Patient reports pain as  2/10  .    Objective: Vital signs in last 24 hours: Vitals:   09/25/15 1505 09/25/15 1850 09/25/15 2021 09/26/15 0444  BP: 122/63 (!) 137/108 129/88 115/78  Pulse: 67 67 69 74  Resp: 17 17 16 16   Temp: 98.1 F (36.7 C) 98.9 F (37.2 C) 98.5 F (36.9 C) 97.6 F (36.4 C)  TempSrc: Oral Oral Oral Oral  SpO2: 96% 96% 98% 97%      Intake/Output from previous day: No intake/output data recorded.   Intake/Output this shift: No intake/output data recorded.   LABORATORY DATA:  Recent Labs  09/24/15 0256 09/25/15 0631  WBC 8.3 6.6  HGB 8.9* 9.4*  HCT 27.5* 29.8*  PLT 126* 132*  NA 139  --   K 3.5  --   CL 102  --   CO2 29  --   BUN 20  --   CREATININE 0.89  --   GLUCOSE 105*  --   CALCIUM 8.7*  --     Examination: Neurologically intact ABD soft Neurovascular intact Sensation intact distally Intact pulses distally Dorsiflexion/Plantar flexion intact Incision: dressing C/D/I No cellulitis present Compartment soft} XR AP&Lat of hip shows well placed\fixed THA  Assessment:   4 Days Post-Op Procedure(s) (LRB): ORIF INTERTROCHANTRIC HIP (Left) ADDITIONAL DIAGNOSIS:  Expected Acute Blood Loss Anemia, Hypertension, hx or falls  Plan: PT/OT WBAT, THA  DVT Prophylaxis: SCDx72 hrs, ASA 325 mg BID x 2 weeks  DISCHARGE PLAN: Skilled Nursing Facility/Rehab  DISCHARGE NEEDS: HHPT, Walker and 3-in-1 comode seat

## 2015-09-26 NOTE — Discharge Summary (Signed)
Physician Discharge Summary  [facility] UJW:[id number] DOB: 27-Oct-1923 DOA: 09/21/2015  PCP: [name],[name], [name]  Admit date: 09/21/2015 Discharge date: 09/26/2015  Admitted From: Home Disposition: [facility] for ST rehabilitation  Recommendations for Outpatient Follow-up:  1. Follow up with PCP in 1-2 weeks 2. Please obtain BMP/CBC in one week 3. Aspirin 325 mg twice a day for one more month for DVT prophylaxis  Home Health: N/A Equipment/Devices: N/A  Discharge Condition: Stable CODE STATUS: Full code Diet recommendation: Heart healthy diet  Brief/Interim Summary: Megan McHoneis a 82 y.o.femalewith medical history significant of past medical history of essential hypertension that comes in for an episode of fall that happened on the day of admission the patient relates she was in the bathroom brushing her hair when her left foot gave out and she felt to the floor left hip first she did not hit her head after this she cannot bear weight she had to crawl back in bed in call her son. She relates she has pain with any type of movement of the left hip.   Discharge Diagnoses:  Active Problems:   Closed left hip fracture (HCC)   Essential hypertension   Pre-op evaluation   Postoperative anemia due to acute blood loss   Closed left hip fracture (HCC)/preop evaluation: -Resulted from a fall, appears to be mechanical, no loss of consciousness. -Orthopedics consulted, status post ORIF of left hip intertrochanteric fracture. -Pain is controlled with opioids, discharged on Vicodin as needed (prescribed by orthopedics). -DVT prophylaxis utilized Lovenox while she is in the hospital, on discharge orthopedics recommended aspirin twice a day for one month.  Essential hypertension: -Blood pressure with reasonable control, restarted meds on discharge.  Acute blood loss anemia  -Postoperative anemia secondary to acute blood loss, patient hemoglobin baseline is 12.8. -Hemoglobin  all way down to 8.9, on discharge 9.4. This is likely to perioperative blood loss. -No transfusion, check CBC within one week.  Dyslipidemia -Restart simvastatin on discharge.  Discharge Instructions  Discharge Instructions    Diet - low sodium heart healthy    Complete by:  As directed   Increase activity slowly    Complete by:  As directed   Partial weight bearing    Complete by:  As directed   % Body Weight:  50%   Laterality:  left   Extremity:  Lower       Medication List    STOP taking these medications   meloxicam 7.5 MG tablet Commonly known as:  MOBIC     TAKE these medications   aspirin EC 325 MG tablet Take 1 tablet (325 mg total) by mouth 2 (two) times daily.   HYDROcodone-acetaminophen 5-325 MG tablet Commonly known as:  NORCO Take 1 tablet by mouth every 6 (six) hours as needed.   methocarbamol 500 MG tablet Commonly known as:  ROBAXIN Take 1 tablet (500 mg total) by mouth 2 (two) times daily with a meal.   oxybutynin 5 MG 24 hr tablet Commonly known as:  DITROPAN-XL Take 5 mg by mouth daily.   simvastatin 40 MG tablet Commonly known as:  ZOCOR Take 40 mg by mouth daily.   traZODone 100 MG tablet Commonly known as:  DESYREL Take 100 mg by mouth at bedtime.   triamterene-hydrochlorothiazide 37.5-25 MG tablet Commonly known as:  MAXZIDE-25 Take 1 tablet by mouth daily.      Follow-up Information    Nestor LewandowskyOWAN,FRANK J, MD Follow up in 2 week(s).   Specialty:  Orthopedic Surgery Contact information:  [address] [place] [address] [phone]          No Known Allergies  Consultations:  Turner Danielsowan, ortho   Procedures/Studies: Dg C-arm 1-60 Min  Result Date: 09/22/2015 CLINICAL DATA:  Status post open reduction internal fixation of of left femoral intertrochanteric fracture EXAM: DG C-ARM 61-120 MIN; OPERATIVE LEFT HIP WITH PELVIS COMPARISON:  09/21/2015 FINDINGS: Five views of the left femur submitted. The patient is status post open  reduction internal fixation of proximal left femoral fracture. There is a metallic fixation pin along the left femoral neck. A metallic fixation plate and metallic fixation screws are noted in proximal shaft of the left femur. There is anatomic alignment. IMPRESSION: Status post open reduction internal fixation of inter trochanteric left femoral fracture. Metallic fixation material in proximal femur with anatomic alignment. Fluoroscopy time was 47 seconds.  Please see the operative report. Please note the films are not marked I assume is the left femur. Electronically Signed   By: Natasha MeadLiviu  Pop M.D.   On: 09/22/2015 16:14   Dg Hip Operative Unilat W Or W/o Pelvis Left  Result Date: 09/22/2015 CLINICAL DATA:  Status post open reduction internal fixation of of left femoral intertrochanteric fracture EXAM: DG C-ARM 61-120 MIN; OPERATIVE LEFT HIP WITH PELVIS COMPARISON:  09/21/2015 FINDINGS: Five views of the left femur submitted. The patient is status post open reduction internal fixation of proximal left femoral fracture. There is a metallic fixation pin along the left femoral neck. A metallic fixation plate and metallic fixation screws are noted in proximal shaft of the left femur. There is anatomic alignment. IMPRESSION: Status post open reduction internal fixation of inter trochanteric left femoral fracture. Metallic fixation material in proximal femur with anatomic alignment. Fluoroscopy time was 47 seconds.  Please see the operative report. Please note the films are not marked I assume is the left femur. Electronically Signed   By: Natasha MeadLiviu  Pop M.D.   On: 09/22/2015 16:14   Dg Hip Unilat With Pelvis 2-3 Views Left  Result Date: 09/21/2015 CLINICAL DATA:  Fall today, left hip pain. EXAM: DG HIP (WITH OR WITHOUT PELVIS) 2-3V LEFT COMPARISON:  None. FINDINGS: There is a displaced/ comminuted fracture within the intratrochanteric region of the left femoral neck, with associated avulsion of the lesser trochanter.  There is near 90 degrees angulation deformity at the fracture site. Femoral head remains well positioned relative to the acetabulum. No fracture seen within the adjacent osseous pelvis. Mild degenerative change noted in the lower lumbar spine. Soft tissues about the pelvis and left hip are unremarkable. IMPRESSION: Acute displaced/comminuted fracture within the left femoral neck, intertrochanteric, with associated angulation deformity at the fracture site and avulsion of the lesser trochanter. Left femoral head remains well positioned relative to the acetabulum. No fracture seen within the adjacent osseous pelvis. Electronically Signed   By: Bary RichardStan  Maynard M.D.   On: 09/21/2015 13:49   (Echo, Carotid, EGD, Colonoscopy, ERCP)    Subjective:   Discharge Exam: Vitals:   09/25/15 2021 09/26/15 0444  BP: 129/88 115/78  Pulse: 69 74  Resp: 16 16  Temp: 98.5 F (36.9 C) 97.6 F (36.4 C)   Vitals:   09/25/15 1505 09/25/15 1850 09/25/15 2021 09/26/15 0444  BP: 122/63 (!) 137/108 129/88 115/78  Pulse: 67 67 69 74  Resp: 17 17 16 16   Temp: 98.1 F (36.7 C) 98.9 F (37.2 C) 98.5 F (36.9 C) 97.6 F (36.4 C)  TempSrc: Oral Oral Oral Oral  SpO2: 96% 96%  98% 97%    General: Pt is alert, awake, not in acute distress Cardiovascular: RRR, S1/S2 +, no rubs, no gallops Respiratory: CTA bilaterally, no wheezing, no rhonchi Abdominal: Soft, NT, ND, bowel sounds + Extremities: no edema, no cyanosis    The results of significant diagnostics from this hospitalization (including imaging, microbiology, ancillary and laboratory) are listed below for reference.     Microbiology: Recent Results (from the past 240 hour(s))  MRSA PCR Screening     Status: None   Collection Time: 09/22/15 12:36 PM  Result Value Ref Range Status   MRSA by PCR NEGATIVE NEGATIVE Final    Comment:        The GeneXpert MRSA Assay (FDA approved for NASAL specimens only), is one component of a comprehensive MRSA  colonization surveillance program. It is not intended to diagnose MRSA infection nor to guide or monitor treatment for MRSA infections.      Labs: BNP (last 3 results) No results for input(s): BNP in the last 8760 hours. Basic Metabolic Panel:  Recent Labs Lab 09/21/15 1240 09/23/15 0437 09/24/15 0256  NA 137 136 139  K 3.9 3.7 3.5  CL 104 104 102  CO2 26 25 29   GLUCOSE 110* 121* 105*  BUN 21* 10 20  CREATININE 0.77 0.67 0.89  CALCIUM 9.4 8.7* 8.7*   Liver Function Tests: No results for input(s): AST, ALT, ALKPHOS, BILITOT, PROT, ALBUMIN in the last 168 hours. No results for input(s): LIPASE, AMYLASE in the last 168 hours. No results for input(s): AMMONIA in the last 168 hours. CBC:  Recent Labs Lab 09/21/15 1240 09/23/15 0437 09/24/15 0256 09/25/15 0631  WBC 11.0* 9.0 8.3 6.6  NEUTROABS 9.1*  --   --   --   HGB 12.8 9.8* 8.9* 9.4*  HCT 38.9 31.3* 27.5* 29.8*  MCV 91.5 92.6 92.9 93.4  PLT 209 137* 126* 132*   Cardiac Enzymes: No results for input(s): CKTOTAL, CKMB, CKMBINDEX, TROPONINI in the last 168 hours. BNP: Invalid input(s): POCBNP CBG: No results for input(s): GLUCAP in the last 168 hours. D-Dimer No results for input(s): DDIMER in the last 72 hours. Hgb A1c No results for input(s): HGBA1C in the last 72 hours. Lipid Profile No results for input(s): CHOL, HDL, LDLCALC, TRIG, CHOLHDL, LDLDIRECT in the last 72 hours. Thyroid function studies No results for input(s): TSH, T4TOTAL, T3FREE, THYROIDAB in the last 72 hours.  Invalid input(s): FREET3 Anemia work up No results for input(s): VITAMINB12, FOLATE, FERRITIN, TIBC, IRON, RETICCTPCT in the last 72 hours. Urinalysis    Component Value Date/Time   COLORURINE YELLOW 09/22/2015 0521   APPEARANCEUR CLEAR 09/22/2015 0521   LABSPEC 1.018 09/22/2015 0521   PHURINE 5.0 09/22/2015 0521   GLUCOSEU NEGATIVE 09/22/2015 0521   HGBUR NEGATIVE 09/22/2015 0521   BILIRUBINUR NEGATIVE 09/22/2015 0521    KETONESUR NEGATIVE 09/22/2015 0521   PROTEINUR NEGATIVE 09/22/2015 0521   NITRITE NEGATIVE 09/22/2015 0521   LEUKOCYTESUR TRACE (A) 09/22/2015 0521   Sepsis Labs Invalid input(s): PROCALCITONIN,  WBC,  LACTICIDVEN Microbiology Recent Results (from the past 240 hour(s))  MRSA PCR Screening     Status: None   Collection Time: 09/22/15 12:36 PM  Result Value Ref Range Status   MRSA by PCR NEGATIVE NEGATIVE Final    Comment:        The GeneXpert MRSA Assay (FDA approved for NASAL specimens only), is one component of a comprehensive MRSA colonization surveillance program. It is not intended to diagnose MRSA infection nor to  guide or monitor treatment for MRSA infections.      Time coordinating discharge: Over 30 minutes  SIGNED:   Clint Lipps, MD  [facility] 09/26/2015, 10:05 AM Pager   If 7PM-7AM, please contact night-coverage [email or web address] Password [email or web address]

## 2015-09-26 NOTE — Progress Notes (Signed)
SW received insurance authorization number: [id number]  RVB  SW spoke with Wille CelesteJanie of [facility] who states that pt is now welcomed to come. She was given authorization number. SW made nurse aware and provided authorization number.  SW will arrange transportation for pt at 1:30. Family is aware.  Crista CurbBrittney Aries Kasa, Social Work

## 2015-11-23 ENCOUNTER — Inpatient Hospital Stay (HOSPITAL_COMMUNITY)
Admission: EM | Admit: 2015-11-23 | Discharge: 2015-11-26 | DRG: 299 | Disposition: A | Discharge status: Home Health | Payer: Medicare Other | Attending: Internal Medicine | Admitting: Internal Medicine

## 2015-11-23 ENCOUNTER — Emergency Department (HOSPITAL_COMMUNITY): Payer: Medicare Other

## 2015-11-23 ENCOUNTER — Encounter (HOSPITAL_COMMUNITY): Payer: Self-pay | Admitting: *Deleted

## 2015-11-23 DIAGNOSIS — Z7722 Contact with and (suspected) exposure to environmental tobacco smoke (acute) (chronic): Secondary | ICD-10-CM | POA: Diagnosis present

## 2015-11-23 DIAGNOSIS — I10 Essential (primary) hypertension: Secondary | ICD-10-CM | POA: Diagnosis present

## 2015-11-23 DIAGNOSIS — Z7982 Long term (current) use of aspirin: Secondary | ICD-10-CM

## 2015-11-23 DIAGNOSIS — I2699 Other pulmonary embolism without acute cor pulmonale: Secondary | ICD-10-CM | POA: Diagnosis not present

## 2015-11-23 DIAGNOSIS — D62 Acute posthemorrhagic anemia: Secondary | ICD-10-CM | POA: Diagnosis present

## 2015-11-23 DIAGNOSIS — Z791 Long term (current) use of non-steroidal anti-inflammatories (NSAID): Secondary | ICD-10-CM

## 2015-11-23 DIAGNOSIS — I2602 Saddle embolus of pulmonary artery with acute cor pulmonale: Secondary | ICD-10-CM | POA: Diagnosis not present

## 2015-11-23 DIAGNOSIS — I82401 Acute embolism and thrombosis of unspecified deep veins of right lower extremity: Principal | ICD-10-CM

## 2015-11-23 DIAGNOSIS — Z66 Do not resuscitate: Secondary | ICD-10-CM | POA: Diagnosis present

## 2015-11-23 DIAGNOSIS — E785 Hyperlipidemia, unspecified: Secondary | ICD-10-CM | POA: Diagnosis present

## 2015-11-23 DIAGNOSIS — Z79899 Other long term (current) drug therapy: Secondary | ICD-10-CM

## 2015-11-23 DIAGNOSIS — R609 Edema, unspecified: Secondary | ICD-10-CM | POA: Diagnosis present

## 2015-11-23 DIAGNOSIS — I2609 Other pulmonary embolism with acute cor pulmonale: Secondary | ICD-10-CM | POA: Diagnosis not present

## 2015-11-23 LAB — CBC WITH DIFFERENTIAL/PLATELET
Basophils Absolute: 0 10*3/uL (ref 0.0–0.1)
Basophils Relative: 0 %
Eosinophils Absolute: 0.2 10*3/uL (ref 0.0–0.7)
Eosinophils Relative: 2 %
HEMATOCRIT: 34 % — AB (ref 36.0–46.0)
Hemoglobin: 10.9 g/dL — ABNORMAL LOW (ref 12.0–15.0)
LYMPHS ABS: 1.6 10*3/uL (ref 0.7–4.0)
LYMPHS PCT: 14 %
MCH: 27.2 pg (ref 26.0–34.0)
MCHC: 32.1 g/dL (ref 30.0–36.0)
MCV: 84.8 fL (ref 78.0–100.0)
MONO ABS: 0.8 10*3/uL (ref 0.1–1.0)
MONOS PCT: 8 %
NEUTROS ABS: 8.4 10*3/uL — AB (ref 1.7–7.7)
Neutrophils Relative %: 76 %
Platelets: 276 10*3/uL (ref 150–400)
RBC: 4.01 MIL/uL (ref 3.87–5.11)
RDW: 14.9 % (ref 11.5–15.5)
WBC: 11 10*3/uL — ABNORMAL HIGH (ref 4.0–10.5)

## 2015-11-23 LAB — PROTIME-INR
INR: 1.05
Prothrombin Time: 13.8 seconds (ref 11.4–15.2)

## 2015-11-23 LAB — COMPREHENSIVE METABOLIC PANEL
ALBUMIN: 2.8 g/dL — AB (ref 3.5–5.0)
ALK PHOS: 57 U/L (ref 38–126)
ALT: 13 U/L — ABNORMAL LOW (ref 14–54)
ANION GAP: 10 (ref 5–15)
AST: 21 U/L (ref 15–41)
BUN: 13 mg/dL (ref 6–20)
CALCIUM: 9.2 mg/dL (ref 8.9–10.3)
CHLORIDE: 98 mmol/L — AB (ref 101–111)
CO2: 26 mmol/L (ref 22–32)
Creatinine, Ser: 0.81 mg/dL (ref 0.44–1.00)
GFR calc Af Amer: >60 mL/min (ref >60)
GFR calc non Af Amer: >60 mL/min (ref >60)
GLUCOSE: 102 mg/dL — AB (ref 65–99)
POTASSIUM: 4.1 mmol/L (ref 3.5–5.1)
SODIUM: 134 mmol/L — AB (ref 135–145)
Total Bilirubin: 0.2 mg/dL — ABNORMAL LOW (ref 0.3–1.2)
Total Protein: 6.3 g/dL — ABNORMAL LOW (ref 6.5–8.1)

## 2015-11-23 LAB — BRAIN NATRIURETIC PEPTIDE: B Natriuretic Peptide: 123.9 pg/mL — ABNORMAL HIGH (ref 0.0–100.0)

## 2015-11-23 LAB — TROPONIN I: Troponin I: <0.03 ng/mL (ref <0.03)

## 2015-11-23 MED ORDER — ONDANSETRON HCL 4 MG PO TABS: 4.0000 mg | ORAL_TABLET | Freq: Four times a day (QID) | ORAL | Status: DC | PRN

## 2015-11-23 MED ORDER — ACETAMINOPHEN 325 MG PO TABS: 650.0000 mg | ORAL_TABLET | Freq: Four times a day (QID) | ORAL | Status: DC | PRN

## 2015-11-23 MED ORDER — TRIAMTERENE-HCTZ 37.5-25 MG PO TABS
1.0000 | ORAL_TABLET | Freq: Every day | ORAL | Status: DC
  Administered 2015-11-24 – 2015-11-26 (×3): 1 via ORAL
  Filled 2015-11-23 (×3): qty 1

## 2015-11-23 MED ORDER — HYDROCODONE-ACETAMINOPHEN 5-325 MG PO TABS
1.0000 | ORAL_TABLET | Freq: Four times a day (QID) | ORAL | Status: DC | PRN
  Administered 2015-11-23 – 2015-11-25 (×3): 1 via ORAL
  Filled 2015-11-23 (×3): qty 1

## 2015-11-23 MED ORDER — HEPARIN (PORCINE) IN NACL 100-0.45 UNIT/ML-% IJ SOLN
900.0000 [IU]/h | INTRAMUSCULAR | Status: DC
  Administered 2015-11-23 – 2015-11-24 (×2): 900 [IU]/h via INTRAVENOUS
  Filled 2015-11-23 (×2): qty 250

## 2015-11-23 MED ORDER — MORPHINE SULFATE (PF) 2 MG/ML IV SOLN
2.0000 mg | INTRAVENOUS | Status: DC | PRN
Start: 2015-11-23 — End: 2015-11-26

## 2015-11-23 MED ORDER — ONDANSETRON HCL 4 MG/2ML IJ SOLN: 4.0000 mg | Freq: Four times a day (QID) | INTRAMUSCULAR | Status: DC | PRN

## 2015-11-23 MED ORDER — IOPAMIDOL (ISOVUE-370) INJECTION 76%
INTRAVENOUS | Status: AC
  Administered 2015-11-23: 80 mL
  Filled 2015-11-23: qty 100

## 2015-11-23 MED ORDER — SODIUM CHLORIDE 0.9% FLUSH
3.0000 mL | Freq: Two times a day (BID) | INTRAVENOUS | Status: DC
  Administered 2015-11-23 – 2015-11-26 (×4): 3 mL via INTRAVENOUS

## 2015-11-23 MED ORDER — HEPARIN BOLUS VIA INFUSION
3000.0000 [IU] | Freq: Once | INTRAVENOUS | Status: AC
  Administered 2015-11-23: 3000 [IU] via INTRAVENOUS
  Filled 2015-11-23: qty 3000

## 2015-11-23 MED ORDER — DOCUSATE SODIUM 100 MG PO CAPS
100.0000 mg | ORAL_CAPSULE | Freq: Two times a day (BID) | ORAL | Status: DC
  Administered 2015-11-23 – 2015-11-26 (×5): 100 mg via ORAL
  Filled 2015-11-23 (×6): qty 1

## 2015-11-23 MED ORDER — SIMVASTATIN 40 MG PO TABS
40.0000 mg | ORAL_TABLET | Freq: Every day | ORAL | Status: DC
  Administered 2015-11-23 – 2015-11-25 (×3): 40 mg via ORAL
  Filled 2015-11-23 (×3): qty 1

## 2015-11-23 MED ORDER — ACETAMINOPHEN 650 MG RE SUPP: 650.0000 mg | Freq: Four times a day (QID) | RECTAL | Status: DC | PRN

## 2015-11-23 NOTE — ED Notes (Addendum)
Popliteal and DP pulses auscultated with doppler in right leg
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
## 2015-11-23 NOTE — ED Notes (Signed)
Reden, RN accepts report at this time.

## 2015-11-23 NOTE — Progress Notes (Signed)
[facility] PCCM  We were asked to see Ms. Panzer for PE based on a CT that showed RV/LV ratio > 1.  She is sleeping comfortably on room air, has stable vital signs.  Would anticoagulate per standard management.  Does not need more aggressive intervention (catheter directed thrombolysis).  PCCM available if needed for formal consultation.  Heber CarolinaBrent McQuaid, MD [facility] Pager: [phone] Cell: [phone] After 3pm or if no response, call [phone]

## 2015-11-23 NOTE — ED Triage Notes (Signed)
Pt coming from [facility] with confirmed blood clot in her right groin. Pt had left hip surgery in Aug. Pt's right leg is significantly more swollen from left leg. Pt reports right leg pain. Pt denies shortness of breath, CP.

## 2015-11-23 NOTE — Progress Notes (Signed)
ANTICOAGULATION CONSULT NOTE - Initial Consult  Pharmacy Consult for heparin Indication: DVT  No Known Allergies  Patient Measurements: Height: 5\' 2"  (157.5 cm) Weight: 120 lb (54.4 kg) IBW/kg (Calculated) : 50.1 Heparin Dosing Weight: 54.4 kg  Vital Signs: Temp: 98.3 F (36.8 C) (10/19 1647) Temp Source: Oral (10/19 1647) BP: 109/67 (10/19 1647) Pulse Rate: 66 (10/19 1647)  Labs:  Recent Labs  11/23/15 1644  HGB 10.9*  HCT 34.0*  PLT 276  LABPROT 13.8  INR 1.05  CREATININE 0.81    Estimated Creatinine Clearance: 36.5 mL/min (by C-G formula based on SCr of 0.81 mg/dL).   Medical History: Past Medical History:  Diagnosis Date  . Difficulty walking   . Hypertension   . Vertigo     Medications:   (Not in a hospital admission)  Assessment: Pt has confirmed DVT in groin. Had hip surgery 8/18. Family reports no anticoagulants used. H/H low 11/12/32, Plt/PTT/INR WNL.  Goal of Therapy:  INR 2-3 Heparin level 0.3-0.7 units/ml Monitor platelets by anticoagulation protocol: Yes   Plan:  Heparin 3000 units IV bolus x1 then 900 units/hr  Lianne CureJustin R Nevah Dalal, PharmD Candidate 11/23/2015,6:02 PM

## 2015-11-23 NOTE — ED Notes (Signed)
Taken to CT at this time. 

## 2015-11-23 NOTE — ED Notes (Signed)
Accepting RN to call back for report

## 2015-11-23 NOTE — ED Notes (Signed)
PT remains in CT 

## 2015-11-24 DIAGNOSIS — I10 Essential (primary) hypertension: Secondary | ICD-10-CM

## 2015-11-24 LAB — BASIC METABOLIC PANEL
Anion gap: 8 (ref 5–15)
BUN: 11 mg/dL (ref 6–20)
CALCIUM: 8.7 mg/dL — AB (ref 8.9–10.3)
CO2: 28 mmol/L (ref 22–32)
CREATININE: 0.76 mg/dL (ref 0.44–1.00)
Chloride: 100 mmol/L — ABNORMAL LOW (ref 101–111)
Glucose, Bld: 85 mg/dL (ref 65–99)
Potassium: 3.5 mmol/L (ref 3.5–5.1)
SODIUM: 136 mmol/L (ref 135–145)

## 2015-11-24 LAB — HEPARIN LEVEL (UNFRACTIONATED)
HEPARIN UNFRACTIONATED: 0.53 [IU]/mL (ref 0.30–0.70)
Heparin Unfractionated: 0.42 IU/mL (ref 0.30–0.70)

## 2015-11-24 LAB — CBC
HEMATOCRIT: 29.8 % — AB (ref 36.0–46.0)
HEMOGLOBIN: 9.4 g/dL — AB (ref 12.0–15.0)
MCH: 26.9 pg (ref 26.0–34.0)
MCHC: 31.5 g/dL (ref 30.0–36.0)
MCV: 85.1 fL (ref 78.0–100.0)
Platelets: 249 10*3/uL (ref 150–400)
RBC: 3.5 MIL/uL — ABNORMAL LOW (ref 3.87–5.11)
RDW: 15.3 % (ref 11.5–15.5)
WBC: 7.7 10*3/uL (ref 4.0–10.5)

## 2015-11-24 LAB — PROTIME-INR
INR: 1.18
Prothrombin Time: 15.1 seconds (ref 11.4–15.2)
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
## 2015-11-24 NOTE — Care Management Note (Signed)
Case Management Note Donn PieriniKristi Lesta Limbert RN, BSN Unit 2W-Case Manager [phone]  Patient Details  Name: Megan Murillo MRN: [id number] Date of Birth: 24-Mar-1923  Subjective/Objective:  Pt admitted with DVT                  Action/Plan: PTA pt lived at home- [facility]- for HHRN/PT/OT- will need resumption orders for discharge- spoke with pt at bedside- per conversation pt recently went home [facility]- states her sister stays with her and son lives 2 doors down. She reports that she has all her needed DME at home- and does not desire to go back to any kind of STSNF- [facility]-. CM to follow for d/c needs- pt currently on IV heparin- unsure plan for oral agent at this time.   Expected Discharge Date:                  Expected Discharge Plan:  Home w Home Health Services  In-House Referral:  Clinical Social Work  Discharge planning Services  CM Consult  Post Acute Care Choice:  Home Health, Resumption of Svcs/PTA Provider Choice offered to:  Patient  DME Arranged:  N/A DME Agency:     HH Arranged:  RN, PT, OT HH Agency:  [facility]  Status of Service:  In process, will continue to follow  If discussed at Long Length of Stay Meetings, dates discussed:    Additional Comments:  Darrold SpanWebster, Azayla Polo Hall, RN 11/24/2015, 4:32 PM

## 2015-11-24 NOTE — Progress Notes (Signed)
ANTICOAGULATION CONSULT NOTE - Follow Up Consult  Pharmacy Consult for Heparin Indication: bilateral pulmonary embolus and DVT  No Known Allergies  Patient Measurements: Height: 5\' 4"  (162.6 cm) Weight: 124 lb 9.6 oz (56.5 kg) IBW/kg (Calculated) : 54.7 Heparin Dosing Weight: 54.4 kg  Vital Signs: Temp: 97.7 F (36.5 C) (10/20 1251) Temp Source: Oral (10/20 1251) BP: 117/56 (10/20 1251) Pulse Rate: 72 (10/20 1251)  Labs:  Recent Labs  11/23/15 1644 11/23/15 1718 11/24/15 0238 11/24/15 1210  HGB 10.9*  --  9.4*  --   HCT 34.0*  --  29.8*  --   PLT 276  --  249  --   LABPROT 13.8  --  15.1  --   INR 1.05  --  1.18  --   HEPARINUNFRC  --   --  0.42 0.53  CREATININE 0.81  --  0.76  --   TROPONINI  --  <0.03  --   --     Estimated Creatinine Clearance: 40.4 mL/min (by C-G formula based on SCr of 0.76 mg/dL).  Assessment: CC: DVT  PMH: Hip surgery 8/18, family reports no blood thinners used  Anticoag: new RLE DVT and diffuse PE with R heart strain. HL within goal x 2 on 900/hr  Nephro: SCr 0.76  Heme/Onc: H&H 9.4/29.8, Plt 249  Goal of Therapy:  Heparin level 0.3-0.7 units/ml Monitor platelets by anticoagulation protocol: Yes   Plan:  Continue heparin 900 units/hr Daily CBC and heparin level  Isaac BlissMichael Ihor Meinzer, PharmD, BCPS, [facility] Clinical Pharmacist Pager [phone] 11/24/2015 1:52 PM

## 2015-11-24 NOTE — Progress Notes (Signed)
ANTICOAGULATION CONSULT NOTE - Follow Up Consult  Pharmacy Consult for Heparin Indication: bilateral pulmonary embolus and DVT  No Known Allergies  Patient Measurements: Height:  (162.6 cm) Weight: 124 lb 9.6 oz (56.5 kg) IBW/kg (Calculated) : 54.7 Heparin Dosing Weight: 54.4 kg  Vital Signs: Temp: 97.8 F (36.6 C) (10/20 0456) Temp Source: Oral (10/20 0456) BP: 128/49 (10/20 0456) Pulse Rate: 63 (10/20 0456)  Labs:  Recent Labs  11/23/15 1644 11/23/15 1718 11/24/15 0238  HGB 10.9*  --  9.4*  HCT 34.0*  --  29.8*  PLT 276  --  249  LABPROT 13.8  --  15.1  INR 1.05  --  1.18  HEPARINUNFRC  --   --  0.42  CREATININE 0.81  --  0.76  TROPONINI  --  <0.03  --     Estimated Creatinine Clearance: 40.4 mL/min (by C-G formula based on SCr of 0.76 mg/dL).   Medications:  Scheduled:  . docusate sodium  100 mg Oral BID  . simvastatin  40 mg Oral q1800  . sodium chloride flush  3 mL Intravenous Q12H  . triamterene-hydrochlorothiazide  1 tablet Oral Daily   Infusions:  . heparin 900 Units/hr (11/23/15 1824)    Assessment: 82 yo F with new DVT and bilateral PE started on heparin 10/19 PM.  Heparin level is therapeutic on 900 units/hr.  Goal of Therapy:  Heparin level 0.3-0.7 units/ml Monitor platelets by anticoagulation protocol: Yes   Plan:  Continue heparin at 900 units/hr. Check heparin level in 6 hours to confirm dose. Continue daily heparin level and CBC   Talecia Sherlin, Pharm.D., BCPS Clinical Pharmacist Pager [phone] 11/24/2015 5:05 AM

## 2015-11-24 NOTE — Clinical Social Work Note (Signed)
CSW consulted as pt's was admitted from [facility]. CSW met with pt to address consult. Pt shared she was discharge home after completing rehab. Pt shared that she does not want to return to rehab and would like to go home. CSW updated RNCM who will follow for discharge planning. CSW is signing off as no further needs identified. Please reconsult if a need arises prior to discharge.   Darden Dates, MSW, LCSW  Clinical Social Worker  [phone]

## 2015-11-24 NOTE — Progress Notes (Signed)
Referral received for possible Lovenox needs- per conversation with PharmD- insurance check completed based on 60 mg BID and 90 mg Daily.  Co-pay Lovenox 60 mg BID. $6.00 for 3 days.  No PA required.   Co-pay Lovenox 100mg . (for  90 mg. Daily dose)  $6.00 30 days No PA required.

## 2015-11-25 DIAGNOSIS — I2602 Saddle embolus of pulmonary artery with acute cor pulmonale: Secondary | ICD-10-CM

## 2015-11-25 LAB — CBC
HCT: 29.8 % — ABNORMAL LOW (ref 36.0–46.0)
Hemoglobin: 9.2 g/dL — ABNORMAL LOW (ref 12.0–15.0)
MCH: 26.3 pg (ref 26.0–34.0)
MCHC: 30.9 g/dL (ref 30.0–36.0)
MCV: 85.1 fL (ref 78.0–100.0)
PLATELETS: 278 10*3/uL (ref 150–400)
RBC: 3.5 MIL/uL — AB (ref 3.87–5.11)
RDW: 15.3 % (ref 11.5–15.5)
WBC: 6.8 10*3/uL (ref 4.0–10.5)

## 2015-11-25 LAB — BASIC METABOLIC PANEL
Anion gap: 7 (ref 5–15)
BUN: 11 mg/dL (ref 6–20)
CO2: 26 mmol/L (ref 22–32)
CREATININE: 0.64 mg/dL (ref 0.44–1.00)
Calcium: 8.6 mg/dL — ABNORMAL LOW (ref 8.9–10.3)
Chloride: 103 mmol/L (ref 101–111)
GFR calc Af Amer: >60 mL/min (ref >60)
GLUCOSE: 83 mg/dL (ref 65–99)
POTASSIUM: 3.6 mmol/L (ref 3.5–5.1)
SODIUM: 136 mmol/L (ref 135–145)

## 2015-11-25 LAB — PROTIME-INR
INR: 1.14
Prothrombin Time: 14.6 seconds (ref 11.4–15.2)

## 2015-11-25 LAB — HEPARIN LEVEL (UNFRACTIONATED): HEPARIN UNFRACTIONATED: 0.26 [IU]/mL — AB (ref 0.30–0.70)

## 2015-11-25 MED ORDER — APIXABAN 5 MG PO TABS
10.0000 mg | ORAL_TABLET | Freq: Two times a day (BID) | ORAL | Status: DC
Start: 2015-11-25 — End: 2015-11-26
  Administered 2015-11-25 – 2015-11-26 (×3): 10 mg via ORAL
  Filled 2015-11-25 (×3): qty 2

## 2015-11-25 MED ORDER — APIXABAN 5 MG PO TABS: 5.0000 mg | ORAL_TABLET | Freq: Two times a day (BID) | ORAL | Status: DC

## 2015-11-25 NOTE — Progress Notes (Signed)
[facility] PROGRESS NOTE  Megan Murillo NWG:[id number] DOB: 12-04-1923 DOA: 11/23/2015   PCP: [name],[name], [name]     Assessment/Plan: Principal Problem:   Pulmonary embolus (HCC) Active Problems:   Essential hypertension   Acute deep vein thrombosis (DVT) of right lower extremity (HCC)   82 y.o. female with medical history significant of vertigo, HTN, and recent left hip fracture (8/17-22) s/p ORIF and rehab presenting with RLE swelling.  Patient completed rehab last Saturday a week ago.  Has been doing home PT.  Has been having foot swelling and wouldn't get better.PCPs office with confirmed DVT of right leg.CT angiogram does show diffuse pulmonary emboli with right heart strain   Assessment and plan DVT/PE -Provoked thromboembolic disease from recent surgery/immobility -Submassive PE by CT report but patient is hemodynamically stable and has good O2 sats on room air -She appears very comfortable despite the circumstances Transition heparin drip to eliquis  Will need NOAC for at least 3-6 months -PCCM consulted and has nothing to add at this time -She is likely to need 48-72 hours of hospitalization to ensure stabilization prior to discharge given the extent of these clots  HTN -Continue maxzide     Acute blood loss anemia  -Postoperative anemia secondary to acute blood loss, patient hemoglobin baseline is 12.8. -Hemoglobin all way down to 8.9, on discharge 9.4. Continues to be 9.4 >9.2 today -   Dyslipidemia Continue simvastatin  DVT prophylaxsis heparin gtt   Code Status:  DNR     Family Communication: Discussed in detail with the patient, all imaging results, lab results explained to the patient   Disposition Plan: Anticipate discharge in the next 1-2 days     Consultants:  None    Procedures:  None  Antibiotics: Anti-infectives    None         HPI/Subjective: Patient appears comfortable denies any chest pain or  shortness of breath, denies any right leg pain  Objective: Vitals:   11/24/15 0456 11/24/15 1251 11/24/15 2030 11/25/15 0429  BP: (!) 128/49 (!) 117/56 124/65 124/65  Pulse: 63 72 60 66  Resp: (!) 22 20 18 18   Temp: 97.8 F (36.6 C) 97.7 F (36.5 C) 98.5 F (36.9 C) 97.9 F (36.6 C)  TempSrc: Oral Oral Oral Oral  SpO2: 98% 96% 95% 96%  Weight:      Height:        Intake/Output Summary (Last 24 hours) at 11/25/15 0819 Last data filed at 11/25/15 [id number]  Gross per 24 hour  Intake              960 ml  Output                0 ml  Net              960 ml    Exam:  Examination:  General exam: Appears calm and comfortable  Respiratory system: Clear to auscultation. Respiratory effort normal. Cardiovascular system: S1 & S2 heard, RRR. No JVD, murmurs, rubs, gallops or clicks. No pedal edema. Gastrointestinal system: Abdomen is nondistended, soft and nontender. No organomegaly or masses felt. Normal bowel sounds heard. Central nervous system: Alert and oriented. No focal neurological deficits. Extremities: Symmetric 5 x 5 power. Skin: No rashes, lesions or ulcers Psychiatry: Judgement and insight appear normal. Mood & affect appropriate.     Data Reviewed: I have personally reviewed following labs and imaging studies  Micro Results No results found for this or  any previous visit (from the past 240 hour(s)).  Radiology Reports Ct Angio Chest Pe W And/or Wo Contrast  Result Date: 11/23/2015 CLINICAL DATA:  Shortness of breath with exertion.  Right leg DVT. EXAM: CT ANGIOGRAPHY CHEST WITH CONTRAST TECHNIQUE: Multidetector CT imaging of the chest was performed using the standard protocol during bolus administration of intravenous contrast. Multiplanar CT image reconstructions and MIPs were obtained to evaluate the vascular anatomy. CONTRAST:  80 cc Isovue 370 COMPARISON:  None. FINDINGS: Cardiovascular: Pulmonary arterial opacification is good. The study is positive for small  volume embolic disease to both lungs. This is more extensive on the right than the left. There is aortic atherosclerosis. There is coronary artery atherosclerosis. Right ventricular to left ventricular ratio is greater than 1, suggesting degree of right ventricular strain. Mediastinum/Nodes: Negative Lungs/Pleura: There is minimal atelectasis posteriorly in the left lower lobe. There is a small amount of pleural fluid on the right with mild-to-moderate atelectasis in right lower lobe. Upper Abdomen: Negative Musculoskeletal: Chronic curvature in degenerative change of the spine. Review of the MIP images confirms the above findings. IMPRESSION: Study is positive for pulmonary emboli, more on the right than the left. Positive for acute PE with CT evidence of right heart strain (RV/LV Ratio = greater than 1) consistent with at least submassive (intermediate risk) PE. The presence of right heart strain has been associated with an increased risk of morbidity and mortality. Please activate Code PE by paging [phone]. Atelectasis in the lower lungs right more left. Aortic atherosclerosis.  Coronary artery atherosclerosis. These results were called by telephone at the time of interpretation on 11/23/2015 at 7:16 pm to Dr. Glynn Octave , who verbally acknowledged these results. Electronically Signed   By: Paulina Fusi M.D.   On: 11/23/2015 19:18     CBC  Recent Labs Lab 11/23/15 1644 11/24/15 0238 11/25/15 0316  WBC 11.0* 7.7 6.8  HGB 10.9* 9.4* 9.2*  HCT 34.0* 29.8* 29.8*  PLT 276 249 278  MCV 84.8 85.1 85.1  MCH 27.2 26.9 26.3  MCHC 32.1 31.5 30.9  RDW 14.9 15.3 15.3  LYMPHSABS 1.6  --   --   MONOABS 0.8  --   --   EOSABS 0.2  --   --   BASOSABS 0.0  --   --     Chemistries   Recent Labs Lab 11/23/15 1644 11/24/15 0238 11/25/15 0316  NA 134* 136 136  K 4.1 3.5 3.6  CL 98* 100* 103  CO2 26 28 26   GLUCOSE 102* 85 83  BUN 13 11 11   CREATININE 0.81 0.76 0.64  CALCIUM 9.2 8.7* 8.6*   AST 21  --   --   ALT 13*  --   --   ALKPHOS 57  --   --   BILITOT 0.2*  --   --    ------------------------------------------------------------------------------------------------------------------ estimated creatinine clearance is 40.4 mL/min (by C-G formula based on SCr of 0.64 mg/dL). ------------------------------------------------------------------------------------------------------------------ No results for input(s): HGBA1C in the last 72 hours. ------------------------------------------------------------------------------------------------------------------ No results for input(s): CHOL, HDL, LDLCALC, TRIG, CHOLHDL, LDLDIRECT in the last 72 hours. ------------------------------------------------------------------------------------------------------------------ No results for input(s): TSH, T4TOTAL, T3FREE, THYROIDAB in the last 72 hours.  Invalid input(s): FREET3 ------------------------------------------------------------------------------------------------------------------ No results for input(s): VITAMINB12, FOLATE, FERRITIN, TIBC, IRON, RETICCTPCT in the last 72 hours.  Coagulation profile  Recent Labs Lab 11/23/15 1644 11/24/15 0238 11/25/15 0316  INR 1.05 1.18 1.14    No results for input(s): DDIMER in the last 72 hours.  Cardiac Enzymes  Recent Labs Lab 11/23/15 1718  TROPONINI <0.03   ------------------------------------------------------------------------------------------------------------------ Invalid input(s): POCBNP   CBG: No results for input(s): GLUCAP in the last 168 hours.     Studies: Ct Angio Chest Pe W And/or Wo Contrast  Result Date: 11/23/2015 CLINICAL DATA:  Shortness of breath with exertion.  Right leg DVT. EXAM: CT ANGIOGRAPHY CHEST WITH CONTRAST TECHNIQUE: Multidetector CT imaging of the chest was performed using the standard protocol during bolus administration of intravenous contrast. Multiplanar CT image reconstructions and  MIPs were obtained to evaluate the vascular anatomy. CONTRAST:  80 cc Isovue 370 COMPARISON:  None. FINDINGS: Cardiovascular: Pulmonary arterial opacification is good. The study is positive for small volume embolic disease to both lungs. This is more extensive on the right than the left. There is aortic atherosclerosis. There is coronary artery atherosclerosis. Right ventricular to left ventricular ratio is greater than 1, suggesting degree of right ventricular strain. Mediastinum/Nodes: Negative Lungs/Pleura: There is minimal atelectasis posteriorly in the left lower lobe. There is a small amount of pleural fluid on the right with mild-to-moderate atelectasis in right lower lobe. Upper Abdomen: Negative Musculoskeletal: Chronic curvature in degenerative change of the spine. Review of the MIP images confirms the above findings. IMPRESSION: Study is positive for pulmonary emboli, more on the right than the left. Positive for acute PE with CT evidence of right heart strain (RV/LV Ratio = greater than 1) consistent with at least submassive (intermediate risk) PE. The presence of right heart strain has been associated with an increased risk of morbidity and mortality. Please activate Code PE by paging [phone]. Atelectasis in the lower lungs right more left. Aortic atherosclerosis.  Coronary artery atherosclerosis. These results were called by telephone at the time of interpretation on 11/23/2015 at 7:16 pm to Dr. Glynn Octave , who verbally acknowledged these results. Electronically Signed   By: Paulina Fusi M.D.   On: 11/23/2015 19:18      No results found for: HGBA1C Lab Results  Component Value Date   CREATININE 0.64 11/25/2015       Scheduled Meds: . docusate sodium  100 mg Oral BID  . simvastatin  40 mg Oral q1800  . sodium chloride flush  3 mL Intravenous Q12H  . triamterene-hydrochlorothiazide  1 tablet Oral Daily   Continuous Infusions: . heparin 900 Units/hr (11/24/15 2240)      LOS: 2 days    Time spent: >30 MINS    [facility]  [facility] Pager [phone]. If 7PM-7AM, please contact night-coverage at [email or web address], password [facility] 11/25/2015, 8:19 AM  LOS: 2 days

## 2015-11-25 NOTE — Progress Notes (Signed)
ANTICOAGULATION CONSULT NOTE  Pharmacy Consult for Heparin change to Apixaban  Indication: bilateral pulmonary embolus and DVT  No Known Allergies  Patient Measurements: Height: 5\' 4"  (162.6 cm) Weight: 124 lb 9.6 oz (56.5 kg) IBW/kg (Calculated) : 54.7 Heparin Dosing Weight: 54.4 kg  Vital Signs: Temp: 97.9 F (36.6 C) (10/21 0429) Temp Source: Oral (10/21 0429) BP: 124/65 (10/21 0429) Pulse Rate: 66 (10/21 0429)  Labs:  Recent Labs  11/23/15 1644 11/23/15 1718 11/24/15 0238 11/24/15 1210 11/25/15 0316  HGB 10.9*  --  9.4*  --  9.2*  HCT 34.0*  --  29.8*  --  29.8*  PLT 276  --  249  --  278  LABPROT 13.8  --  15.1  --  14.6  INR 1.05  --  1.18  --  1.14  HEPARINUNFRC  --   --  0.42 0.53 0.26*  CREATININE 0.81  --  0.76  --  0.64  TROPONINI  --  <0.03  --   --   --     Estimated Creatinine Clearance: 40.4 mL/min (by C-G formula based on SCr of 0.64 mg/dL).  Assessment: CC: DVT/PE  PMH: Hip surgery 8/18, family reports no blood thinners used  Anticoag: new RLE DVT and diffuse PE with R heart strain. Pharmacy was consulted to transition patient from heparin to apixaban. No dose modifications for PE/DVT apixaban dosing. No bleeding noted.   Nephro: SCr 0.64  Heme/Onc: H&H 9.2/29.8 (stable), platelet count 278   Goal of Therapy:  Anticoagulation Monitor platelets by anticoagulation protocol: Yes   Plan:  Stop heparin  Start Apixaban 10mg  PO BID x 7 days, then 5mg  PO BID thereafter  Monitor CBC, renal function, and signs of bleeding  Carylon PerchesMaggie Shuda, PharmD Acute Care Pharmacy Resident  Pager: [phone] 11/25/2015

## 2015-11-25 NOTE — Evaluation (Signed)
Physical Therapy Evaluation Patient Details Name: Megan Murillo MRN: [id number] DOB: May 21, 1923 Today's Date: 11/25/2015   History of Present Illness  Patient is a 82 yo female admitted 11/23/15 with confirmed extensive DVT in RLE.  CT angiogram does show diffuse pulmonary emboli with right heart strain.   PMH:  vertigo, HTN, and recent left hip fracture (8/17-22) s/p ORIF and rehab   Clinical Impression  Patient presents with problems listed below.  Will benefit from acute PT to maximize functional mobility prior to discharge.  Recommend patient continue HHPT services at d/c for mobility/gait/balance training.    Follow Up Recommendations Home health PT;Supervision for mobility/OOB    Equipment Recommendations  None recommended by PT    Recommendations for Other Services       Precautions / Restrictions Precautions Precautions: Fall Precaution Comments: Prior fall related to vertigo per patient. Restrictions Weight Bearing Restrictions: No      Mobility  Bed Mobility Overal bed mobility: Needs Assistance Bed Mobility: Supine to Sit;Sit to Supine     Supine to sit: Min guard Sit to supine: Min guard   General bed mobility comments: Assist for safety only.  Transfers Overall transfer level: Needs assistance Equipment used: 1 person hand held assist Transfers: Sit to/from Stand Sit to Stand: Min guard;Min assist         General transfer comment: Patient uses correct hand placement.  Assist to steady during transfers.  Ambulation/Gait             General Gait Details: Patient declined.  Had just ambulated in hallway with RW and RN assist.  Stairs            Wheelchair Mobility    Modified Rankin (Stroke Patients Only)       Balance Overall balance assessment: Needs assistance         Standing balance support: Single extremity supported Standing balance-Leahy Scale: Poor Standing balance comment: Requires UE support for balance.                              Pertinent Vitals/Pain Pain Assessment: No/denies pain    Home Living Family/patient expects to be discharged to:: Private residence Living Arrangements: Alone Available Help at Discharge: Family;Available 24 hours/day (Sister staying with patient; Son lives 2 doors down.) Type of Home: Apartment Home Access: Level entry     Home Layout: One level Home Equipment: Environmental consultant - 2 wheels;Bedside commode;Wheelchair - manual      Prior Function Level of Independence: Independent with assistive device(s);Needs assistance   Gait / Transfers Assistance Needed: Patient ambulating with RW.  Continuing HHPT after discharged from [facility]  ADL's / Homemaking Assistance Needed: Assist for housekeeping, meal prep.  Comments: Information related to function following [facility] rehab.  Prior to fall/hip fx, patient was independent.     Hand Dominance        Extremity/Trunk Assessment   Upper Extremity Assessment: Overall WFL for tasks assessed           Lower Extremity Assessment: RLE deficits/detail;LLE deficits/detail RLE Deficits / Details: Edema due to DVT.  Strength grossly 4-/5. LLE Deficits / Details: General weakness following Lt hip ORIF.  Grossly 4/5.     Communication   Communication: HOH  Cognition Arousal/Alertness: Awake/alert Behavior During Therapy: WFL for tasks assessed/performed Overall Cognitive Status: Within Functional Limits for tasks assessed  General Comments      Exercises     Assessment/Plan    PT Assessment Patient needs continued PT services  PT Problem List Decreased strength;Decreased activity tolerance;Decreased balance;Decreased mobility;Cardiopulmonary status limiting activity          PT Treatment Interventions DME instruction;Gait training;Functional mobility training;Therapeutic activities;Therapeutic exercise;Patient/family education    PT Goals (Current goals can  be found in the Care Plan section)  Acute Rehab PT Goals Patient Stated Goal: To be able to go back to church. PT Goal Formulation: With patient Time For Goal Achievement: 12/02/15 Potential to Achieve Goals: Good    Frequency Min 3X/week   Barriers to discharge Decreased caregiver support Patient lives alone.  Sister is staying with patient and son lives nearby.    Co-evaluation               End of Session Equipment Utilized During Treatment: Gait belt Activity Tolerance: Patient limited by fatigue Patient left: in bed;with call bell/phone within reach;with nursing/sitter in room           Time: [id number] PT Time Calculation (min) (ACUTE ONLY): 21 min   Charges:   PT Evaluation $PT Eval Moderate Complexity: 1 Procedure     PT G CodesVena Austria:        Tavius Turgeon H 11/25/2015, 3:31 PM Durenda HurtSusan H. Renaldo Fiddleravis, PT, [facility] Acute Rehab Services Pager [phone]

## 2015-11-26 LAB — PROTIME-INR
INR: 1.42
Prothrombin Time: 17.4 seconds — ABNORMAL HIGH (ref 11.4–15.2)

## 2015-11-26 LAB — CBC
HEMATOCRIT: 30.5 % — AB (ref 36.0–46.0)
HEMOGLOBIN: 9.5 g/dL — AB (ref 12.0–15.0)
MCH: 26.7 pg (ref 26.0–34.0)
MCHC: 31.1 g/dL (ref 30.0–36.0)
MCV: 85.7 fL (ref 78.0–100.0)
Platelets: 277 10*3/uL (ref 150–400)
RBC: 3.56 MIL/uL — AB (ref 3.87–5.11)
RDW: 15.3 % (ref 11.5–15.5)
WBC: 6.9 10*3/uL (ref 4.0–10.5)

## 2015-11-26 MED ORDER — APIXABAN 5 MG PO TABS: 10.0000 mg | ORAL_TABLET | Freq: Two times a day (BID) | ORAL | 0 refills | Status: DC

## 2015-11-26 MED ORDER — DOCUSATE SODIUM 100 MG PO CAPS: 100.0000 mg | ORAL_CAPSULE | Freq: Two times a day (BID) | ORAL | 0 refills | Status: AC

## 2015-11-26 MED ORDER — APIXABAN 5 MG PO TABS: 5.0000 mg | ORAL_TABLET | Freq: Two times a day (BID) | ORAL | 5 refills | Status: DC

## 2015-11-26 NOTE — Discharge Summary (Signed)
Physician Discharge Summary  Megan Murillo MRN: [id number] DOB/AGE: Oct 08, 1923 82 y.o.  PCP: Vicenta Aly, [name]   Admit date: 11/23/2015 Discharge date: 11/26/2015  Discharge Diagnoses:    Principal Problem:   Pulmonary embolus [facility]) Active Problems:   Essential hypertension   Acute deep vein thrombosis (DVT) of right lower extremity ([place])    Follow-up recommendations Follow-up with PCP in 3-5 days , including all  additional recommended appointments as below Follow-up CBC, CMP in 3-5 days       Current Discharge Medication List    START taking these medications   Details  !! apixaban (ELIQUIS) 5 MG TABS tablet Take 2 tablets (10 mg total) by mouth 2 (two) times daily. Qty: 14 tablet, Refills: 0    !! apixaban (ELIQUIS) 5 MG TABS tablet Take 1 tablet (5 mg total) by mouth 2 (two) times daily. Qty: 60 tablet, Refills: 5    docusate sodium (COLACE) 100 MG capsule Take 1 capsule (100 mg total) by mouth 2 (two) times daily. Qty: 10 capsule, Refills: 0     !! - Potential duplicate medications found. Please discuss with provider.    CONTINUE these medications which have NOT CHANGED   Details  HYDROcodone-acetaminophen (NORCO) 5-325 MG tablet Take 1 tablet by mouth every 6 (six) hours as needed. Qty: 60 tablet, Refills: 0    meloxicam (MOBIC) 7.5 MG tablet Take 7.5 mg by mouth 2 (two) times daily.    simvastatin (ZOCOR) 40 MG tablet Take 40 mg by mouth daily. Refills: 0      STOP taking these medications     triamterene-hydrochlorothiazide (MAXZIDE-25) 37.5-25 MG tablet          Discharge Condition: Stable   Discharge Instructions Get Medicines reviewed Megan adjusted: Please take all your medications with you for your next visit with your Primary MD  Please request your Primary MD to go over all hospital tests Megan procedure/radiological results at the follow up, please ask your Primary MD to get all Hospital records sent to his/her office.  If you  experience worsening of your admission symptoms, develop shortness of breath, life threatening emergency, suicidal or homicidal thoughts you must seek medical attention immediately by calling 911 or calling your MD immediately if symptoms less severe.  You must read complete instructions/literature along with all the possible adverse reactions/side effects for all the Medicines you take Megan that have been prescribed to you. Take any new Medicines after you have completely understood Megan accpet all the possible adverse reactions/side effects.   Do not drive when taking Pain medications.   Do not take more than prescribed Pain, Sleep Megan Anxiety Medications  Special Instructions: If you have smoked or chewed Tobacco in the last 2 yrs please stop smoking, stop any regular Alcohol Megan or any Recreational drug use.  Wear Seat belts while driving.  Please note  You were cared for by a hospitalist during your hospital stay. Once you are discharged, your primary care physician will handle any further medical issues. Please note that NO REFILLS for any discharge medications will be authorized once you are discharged, as it is imperative that you return to your primary care physician (or establish a relationship with a primary care physician if you do not have one) for your aftercare needs so that they can reassess your need for medications Megan monitor your lab values.  Discharge Instructions    Diet - low sodium heart healthy    Complete by:  As directed  Increase activity slowly    Complete by:  As directed        No Known Allergies    Disposition: Home with home health   Consults:  None    Significant Diagnostic Studies:  Ct Angio Chest Pe W Megan/or Wo Contrast  Result Date: 11/23/2015 CLINICAL DATA:  Shortness of breath with exertion.  Right leg DVT. EXAM: CT ANGIOGRAPHY CHEST WITH CONTRAST TECHNIQUE: Multidetector CT imaging of the chest was performed using the standard  protocol during bolus administration of intravenous contrast. Multiplanar CT image reconstructions Megan MIPs were obtained to evaluate the vascular anatomy. CONTRAST:  80 cc Isovue 370 COMPARISON:  None. FINDINGS: Cardiovascular: Pulmonary arterial opacification is good. The study is positive for small volume embolic disease to both lungs. This is more extensive on the right than the left. There is aortic atherosclerosis. There is coronary artery atherosclerosis. Right ventricular to left ventricular ratio is greater than 1, suggesting degree of right ventricular Murillo. Mediastinum/Nodes: Negative Lungs/Pleura: There is minimal atelectasis posteriorly in the left lower lobe. There is a small amount of pleural fluid on the right with mild-to-moderate atelectasis in right lower lobe. Upper Abdomen: Negative Musculoskeletal: Chronic curvature in degenerative change of the spine. Review of the MIP images confirms the above findings. IMPRESSION: Study is positive for pulmonary emboli, more on the right than the left. Positive for acute PE with CT evidence of right heart Murillo (RV/LV Ratio = greater than 1) consistent with at least submassive (intermediate risk) PE. The presence of right heart Murillo has been associated with an increased risk of morbidity Megan mortality. Please activate Code PE by paging [phone]. Atelectasis in the lower lungs right more left. Aortic atherosclerosis.  Coronary artery atherosclerosis. These results were called by telephone at the time of interpretation on 11/23/2015 at 7:16 pm to Dr. Ezequiel Essex , who verbally acknowledged these results. Electronically Signed   By: Nelson Chimes M.D.   On: 11/23/2015 19:18       Filed Weights   11/23/15 1648 11/23/15 2138  Weight: 54.4 kg (120 lb) 56.5 kg (124 lb 9.6 oz)     Microbiology: No results found for this or any previous visit (from the past 240 hour(s)).     Blood Culture No results found for: SDES, [place], CULT,  REPTSTATUS    Labs: Results for orders placed or performed during the hospital encounter of 11/23/15 (from the past 48 hour(s))  Heparin level (unfractionated)     Status: None   Collection Time: 11/24/15 12:10 PM  Result Value Ref Range   Heparin Unfractionated 0.53 0.30 - 0.70 IU/mL    Comment:        IF HEPARIN RESULTS ARE BELOW EXPECTED VALUES, Megan PATIENT DOSAGE HAS BEEN CONFIRMED, SUGGEST FOLLOW UP TESTING OF ANTITHROMBIN III LEVELS.   CBC     Status: Abnormal   Collection Time: 11/25/15  3:16 AM  Result Value Ref Range   WBC 6.8 4.0 - 10.5 K/uL   RBC 3.50 (L) 3.87 - 5.11 MIL/uL   Hemoglobin 9.2 (L) 12.0 - 15.0 g/dL   HCT Megan.8 (L) 36.0 - 46.0 %   MCV 85.1 78.0 - 100.0 fL   MCH 26.3 26.0 - 34.0 pg   MCHC 30.9 30.0 - 36.0 g/dL   RDW 15.3 11.5 - 15.5 %   Platelets 278 150 - 400 K/uL  Protime-INR     Status: None   Collection Time: 11/25/15  3:16 AM  Result Value Ref Range  Prothrombin Time 14.6 11.4 - 15.2 seconds   INR 1.14   Heparin level (unfractionated)     Status: Abnormal   Collection Time: 11/25/15  3:16 AM  Result Value Ref Range   Heparin Unfractionated 0.26 (L) 0.30 - 0.70 IU/mL    Comment:        IF HEPARIN RESULTS ARE BELOW EXPECTED VALUES, Megan PATIENT DOSAGE HAS BEEN CONFIRMED, SUGGEST FOLLOW UP TESTING OF ANTITHROMBIN III LEVELS.   Basic metabolic panel     Status: Abnormal   Collection Time: 11/25/15  3:16 AM  Result Value Ref Range   Sodium 136 135 - 145 mmol/L   Potassium 3.6 3.5 - 5.1 mmol/L   Chloride 103 101 - 111 mmol/L   CO2 26 22 - 32 mmol/L   Glucose, Bld 83 65 - 99 mg/dL   BUN 11 6 - 20 mg/dL   Creatinine, Ser 0.64 0.44 - 1.00 mg/dL   Calcium 8.6 (L) 8.9 - 10.3 mg/dL   GFR calc non Af Amer >60 >60 mL/min   GFR calc Af Amer >60 >60 mL/min    Comment: (NOTE) The eGFR has been calculated using the CKD EPI equation. This calculation has not been validated in all clinical situations. eGFR's persistently <60 mL/min signify possible  Chronic Kidney Disease.    Anion gap 7 5 - 15  CBC     Status: Abnormal   Collection Time: 11/26/15  4:02 AM  Result Value Ref Range   WBC 6.9 4.0 - 10.5 K/uL   RBC 3.56 (L) 3.87 - 5.11 MIL/uL   Hemoglobin 9.5 (L) 12.0 - 15.0 g/dL   HCT 30.5 (L) 36.0 - 46.0 %   MCV 85.7 78.0 - 100.0 fL   MCH 26.7 26.0 - 34.0 pg   MCHC 31.1 30.0 - 36.0 g/dL   RDW 15.3 11.5 - 15.5 %   Platelets 277 150 - 400 K/uL  Protime-INR     Status: Abnormal   Collection Time: 11/26/15  4:02 AM  Result Value Ref Range   Prothrombin Time 17.4 (H) 11.4 - 15.2 seconds   INR 1.42       HPI :  Megan Murillo, Megan Murillo, Megan Murillo  HOSPITAL COURSE: *   DVT/PE -Provoked thromboembolic disease from recent surgery/immobility -Submassive PE by CT report but patient is hemodynamically stable Megan has good O2 sats on room air -She appears very comfortable despite the circumstances Initially started on heparin drip, Transitioned  heparin drip to eliquis  Will need NOAC for at least 3-6 months -PCCM consulted Megan has nothing to add at this time    Megan Murillo  Stopped maxzide, blood pressure very low , on this medication. This may potentially increase her risk of falls     Acute blood loss anemia -Postoperative anemia secondary to acute blood loss, patient hemoglobin baseline is 12.8. -Hemoglobin all way down to 8.9, on discharge 9.4. Continues to be 9.4 >9.2 today -   Dyslipidemia Continue simvastatin   Discharge Exam:  Blood pressure (!) 108/51, pulse (!) 52, temperature 98.1 F (36.7 C), temperature source Oral, resp. rate 16, height 5' 4"  (1.626 m),  weight 56.5 kg (124 lb 9.6 oz),  SpO2 98 %.   General exam: Appears calm Megan comfortable  Respiratory system: Clear to auscultation. Respiratory effort normal. Cardiovascular system: S1 & S2 heard, RRR. No JVD, murmurs, rubs, gallops or clicks. No pedal edema. Gastrointestinal system: Abdomen is nondistended, soft Megan nontender. No organomegaly or masses felt. Normal bowel sounds heard. Central nervous system: Alert Megan oriented. No focal neurological deficits. Extremities: Symmetric 5 x 5 power. Skin: No rashes, lesions or ulcers Psychiatry: Judgement Megan insight appear normal. Mood & affect appropriate.     Follow-up Information    ANDERSON,[name], [name]. Schedule an appointment as soon as possible for a visit in 2 day(s).   Specialty:  Nurse Practitioner Why:  Hospital follow-up Contact information: [place] [address] [phone]           Signed: Reyne Dumas 11/26/2015, 11:26 AM        Time spent >45 mins

## 2015-11-26 NOTE — Evaluation (Signed)
Occupational Therapy Evaluation Patient Details Name: Megan MilchCallie Hemp MRN: [id number] DOB: 1923-09-30 Today's Date: 11/26/2015    History of Present Illness Patient is a 82 y.o. female admitted 11/23/15 with confirmed extensive DVT in RLE.  CT angiogram does show diffuse pulmonary emboli with right heart strain.   PMH:  vertigo, HTN, and recent left hip fracture (8/17-22) s/p ORIF and rehab    Clinical Impression   Pt admitted with above. Pt getting assist with ADLs and IADLs, PTA. Feel pt will benefit from acute OT to increase independence prior to d/c. Recommending HHOT upon d/c.    Follow Up Recommendations  Home health OT;Supervision - Intermittent    Equipment Recommendations  None recommended by OT    Recommendations for Other Services       Precautions / Restrictions Precautions Precautions: Fall Precaution Comments: Prior fall related to vertigo per patient. Restrictions Weight Bearing Restrictions: No      Mobility Bed Mobility Overal bed mobility: Modified Independent Bed Mobility: Supine to Sit;Sit to Supine        Comments: OT adjusted HOB for pt      Transfers Overall transfer level: Needs assistance Equipment used: Rolling walker (2 wheeled) Transfers: Sit to/from Stand Sit to Stand: Min assist         General transfer comment: Min guard for sit to stand from bed; Min A for sit to stand from chair    Balance    Used RW for ambulation.                                        ADL Overall ADL's : Needs assistance/impaired                     Lower Body Dressing: Maximal assistance;Sit to/from stand   Toilet Transfer: Minimal assistance;Ambulation;RW (sit to stand from chair and bed) Toilet Transfer Details (indicate cue type and reason): Min assist for ambulation; Min assist for sit to stand from chair         Functional mobility during ADLs: Minimal assistance;Rolling walker General ADL Comments: Talked  briefly about AE. Pt planning to let sister assist her for now. Talked with pt about energy conservation.     Vision     Perception     Praxis      Pertinent Vitals/Pain Pain Assessment: 0-10 Pain Score:  (7-8) Pain Location: RLE when trying to reach sock Pain Descriptors / Indicators: Pressure Pain Intervention(s): Monitored during session;Limited activity within patient's tolerance     Hand Dominance     Extremity/Trunk Assessment Upper Extremity Assessment Upper Extremity Assessment: Overall WFL for tasks assessed   Lower Extremity Assessment Lower Extremity Assessment: Defer to PT evaluation (edema in RLE)       Communication Communication Communication: HOH   Cognition Arousal/Alertness: Awake/alert Behavior During Therapy: WFL for tasks assessed/performed Overall Cognitive Status: Within Functional Limits for tasks assessed                     General Comments       Exercises       Shoulder Instructions      Home Living Family/patient expects to be discharged to:: Private residence Living Arrangements: Other relatives (sister going to stay with her; son lives near) Available Help at Discharge: Family;Available 24 hours/day Type of Home: Apartment Home Access: Level entry  Home Layout: One level     Bathroom Shower/Tub:  (pt sponge bathes)         Home Equipment: Walker - 2 wheels;Bedside commode;Wheelchair - manual          Prior Functioning/Environment Level of Independence: Needs assistance    ADL's / Homemaking Assistance Needed: assist with bathing and dressing, housekeeping and meal prep   Comments: Per PT eval, pt independent prior to fall/hip fx        OT Problem List: Decreased strength;Decreased range of motion;Decreased knowledge of use of DME or AE;Decreased activity tolerance;Impaired balance (sitting and/or standing);Pain;Increased edema   OT Treatment/Interventions: Self-care/ADL training;DME and/or AE  instruction;Energy conservation;Therapeutic activities;Patient/family education;Balance training    OT Goals(Current goals can be found in the care plan section) Acute Rehab OT Goals Patient Stated Goal: do housework and live like I was OT Goal Formulation: With patient Time For Goal Achievement: 12/03/15 Potential to Achieve Goals: Good ADL Goals Pt Will Transfer to Toilet: with supervision;ambulating;regular height toilet Additional ADL Goal #1: Pt will independently verbalize 3 energy conservation techniques and utilize in session as needed.  OT Frequency: Min 2X/week   Barriers to D/C:            Co-evaluation              End of Session Equipment Utilized During Treatment: Gait belt;Rolling walker Nurse Communication: Mobility status;Other (comment) (d/c recommendation)  Activity Tolerance: Patient tolerated treatment well;Other (comment) (became SOB) Patient left: in bed;with call bell/phone within reach   Time: 1610-9604 OT Time Calculation (min): 15 min Charges:  OT General Charges $OT Visit: 1 Procedure OT Evaluation $OT Eval Moderate Complexity: 1 Procedure G-Codes:    Samie Barclift L OTR/L 11/26/2015, 10:40 AM

## 2015-11-26 NOTE — Discharge Instructions (Signed)
Information on my medicine - ELIQUIS (apixaban)  This medication education was reviewed with me or my healthcare representative as part of my discharge preparation.  The pharmacist that spoke with me during my hospital stay was:  Dennie Fettersgan, Mirielle Byrum Donovan, [facility]  Why was Eliquis prescribed for you? Eliquis was prescribed to treat blood clots that may have been found in the veins of your legs (deep vein thrombosis) or in your lungs (pulmonary embolism) and to reduce the risk of them occurring again.  What do You need to know about Eliquis ? The starting dose is 10 mg (two 5 mg tablets) taken TWICE daily for the FIRST SEVEN (7) DAYS, then on (enter date)  12/02/15 the dose is reduced to ONE 5 mg tablet taken TWICE daily.  Eliquis may be taken with or without food.   Try to take the dose about the same time in the morning and in the evening. If you have difficulty swallowing the tablet whole please discuss with your pharmacist how to take the medication safely.  Take Eliquis exactly as prescribed and DO NOT stop taking Eliquis without talking to the doctor who prescribed the medication.  Stopping may increase your risk of developing a new blood clot.  Refill your prescription before you run out.  After discharge, you should have regular check-up appointments with your healthcare provider that is prescribing your Eliquis.    What do you do if you miss a dose? If a dose of ELIQUIS is not taken at the scheduled time, take it as soon as possible on the same day and twice-daily administration should be resumed. The dose should not be doubled to make up for a missed dose.  Important Safety Information A possible side effect of Eliquis is bleeding. You should call your healthcare provider right away if you experience any of the following: ? Bleeding from an injury or your nose that does not stop. ? Unusual colored urine (red or dark brown) or unusual colored stools (red or black). ? Unusual  bruising for unknown reasons. ? A serious fall or if you hit your head (even if there is no bleeding).  Some medicines may interact with Eliquis and might increase your risk of bleeding or clotting while on Eliquis. To help avoid this, consult your healthcare provider or pharmacist prior to using any new prescription or non-prescription medications, including herbals, vitamins, non-steroidal anti-inflammatory drugs (NSAIDs) and supplements.  This website has more information on Eliquis (apixaban): http://www.eliquis.com/eliquis/home

## 2016-06-29 ENCOUNTER — Inpatient Hospital Stay (HOSPITAL_COMMUNITY)
Admission: EM | Admit: 2016-06-29 | Discharge: 2016-07-03 | DRG: 481 | Disposition: A | Discharge status: Skilled Nursing Facility | Payer: Medicare Other | Attending: Internal Medicine | Admitting: Internal Medicine

## 2016-06-29 ENCOUNTER — Encounter (HOSPITAL_COMMUNITY): Payer: Self-pay | Admitting: Nurse Practitioner

## 2016-06-29 ENCOUNTER — Emergency Department (HOSPITAL_COMMUNITY): Payer: Medicare Other

## 2016-06-29 DIAGNOSIS — S72001A Fracture of unspecified part of neck of right femur, initial encounter for closed fracture: Secondary | ICD-10-CM | POA: Diagnosis not present

## 2016-06-29 DIAGNOSIS — S72001S Fracture of unspecified part of neck of right femur, sequela: Secondary | ICD-10-CM | POA: Diagnosis not present

## 2016-06-29 DIAGNOSIS — D62 Acute posthemorrhagic anemia: Secondary | ICD-10-CM | POA: Diagnosis not present

## 2016-06-29 DIAGNOSIS — I2699 Other pulmonary embolism without acute cor pulmonale: Secondary | ICD-10-CM | POA: Diagnosis not present

## 2016-06-29 DIAGNOSIS — I82401 Acute embolism and thrombosis of unspecified deep veins of right lower extremity: Secondary | ICD-10-CM | POA: Diagnosis present

## 2016-06-29 DIAGNOSIS — W1830XA Fall on same level, unspecified, initial encounter: Secondary | ICD-10-CM | POA: Diagnosis present

## 2016-06-29 DIAGNOSIS — Z86718 Personal history of other venous thrombosis and embolism: Secondary | ICD-10-CM

## 2016-06-29 DIAGNOSIS — D72829 Elevated white blood cell count, unspecified: Secondary | ICD-10-CM | POA: Diagnosis present

## 2016-06-29 DIAGNOSIS — R748 Abnormal levels of other serum enzymes: Secondary | ICD-10-CM | POA: Diagnosis not present

## 2016-06-29 DIAGNOSIS — I10 Essential (primary) hypertension: Secondary | ICD-10-CM | POA: Diagnosis present

## 2016-06-29 DIAGNOSIS — R609 Edema, unspecified: Secondary | ICD-10-CM

## 2016-06-29 DIAGNOSIS — M25551 Pain in right hip: Secondary | ICD-10-CM | POA: Diagnosis present

## 2016-06-29 DIAGNOSIS — M6282 Rhabdomyolysis: Secondary | ICD-10-CM | POA: Diagnosis not present

## 2016-06-29 DIAGNOSIS — E860 Dehydration: Secondary | ICD-10-CM | POA: Diagnosis present

## 2016-06-29 DIAGNOSIS — S72009A Fracture of unspecified part of neck of unspecified femur, initial encounter for closed fracture: Secondary | ICD-10-CM | POA: Insufficient documentation

## 2016-06-29 DIAGNOSIS — E876 Hypokalemia: Secondary | ICD-10-CM | POA: Diagnosis not present

## 2016-06-29 DIAGNOSIS — T148XXA Other injury of unspecified body region, initial encounter: Secondary | ICD-10-CM

## 2016-06-29 DIAGNOSIS — S72141A Displaced intertrochanteric fracture of right femur, initial encounter for closed fracture: Secondary | ICD-10-CM | POA: Diagnosis present

## 2016-06-29 DIAGNOSIS — Z86711 Personal history of pulmonary embolism: Secondary | ICD-10-CM

## 2016-06-29 DIAGNOSIS — Y92009 Unspecified place in unspecified non-institutional (private) residence as the place of occurrence of the external cause: Secondary | ICD-10-CM | POA: Diagnosis not present

## 2016-06-29 DIAGNOSIS — R296 Repeated falls: Secondary | ICD-10-CM | POA: Diagnosis present

## 2016-06-29 DIAGNOSIS — W19XXXA Unspecified fall, initial encounter: Secondary | ICD-10-CM

## 2016-06-29 DIAGNOSIS — I82409 Acute embolism and thrombosis of unspecified deep veins of unspecified lower extremity: Secondary | ICD-10-CM | POA: Insufficient documentation

## 2016-06-29 HISTORY — DX: Acute embolism and thrombosis of unspecified deep veins of unspecified lower extremity: I82.409

## 2016-06-29 HISTORY — DX: Other pulmonary embolism without acute cor pulmonale: I26.99

## 2016-06-29 LAB — CBC WITH DIFFERENTIAL/PLATELET
BASOS ABS: 0 10*3/uL (ref 0.0–0.1)
Basophils Relative: 0 %
EOS ABS: 0 10*3/uL (ref 0.0–0.7)
Eosinophils Relative: 0 %
HCT: 36.8 % (ref 36.0–46.0)
HEMOGLOBIN: 12.3 g/dL (ref 12.0–15.0)
LYMPHS ABS: 0.8 10*3/uL (ref 0.7–4.0)
LYMPHS PCT: 5 %
MCH: 29.8 pg (ref 26.0–34.0)
MCHC: 33.4 g/dL (ref 30.0–36.0)
MCV: 89.1 fL (ref 78.0–100.0)
Monocytes Absolute: 0.9 10*3/uL (ref 0.1–1.0)
Monocytes Relative: 6 %
NEUTROS PCT: 89 %
Neutro Abs: 12.9 10*3/uL — ABNORMAL HIGH (ref 1.7–7.7)
Platelets: 203 10*3/uL (ref 150–400)
RBC: 4.13 MIL/uL (ref 3.87–5.11)
RDW: 14 % (ref 11.5–15.5)
WBC: 14.7 10*3/uL — ABNORMAL HIGH (ref 4.0–10.5)

## 2016-06-29 LAB — PROTIME-INR
INR: 1.08
PROTHROMBIN TIME: 14.1 s (ref 11.4–15.2)

## 2016-06-29 LAB — URINALYSIS, ROUTINE W REFLEX MICROSCOPIC
BACTERIA UA: NONE SEEN
BILIRUBIN URINE: NEGATIVE
Glucose, UA: NEGATIVE mg/dL
Ketones, ur: 5 mg/dL — AB
LEUKOCYTES UA: NEGATIVE
Nitrite: NEGATIVE
PH: 5 (ref 5.0–8.0)
Protein, ur: 30 mg/dL — AB
SPECIFIC GRAVITY, URINE: 1.024 (ref 1.005–1.030)
SQUAMOUS EPITHELIAL / LPF: NONE SEEN

## 2016-06-29 LAB — BASIC METABOLIC PANEL
Anion gap: 11 (ref 5–15)
BUN: 39 mg/dL — ABNORMAL HIGH (ref 6–20)
CHLORIDE: 101 mmol/L (ref 101–111)
CO2: 24 mmol/L (ref 22–32)
Calcium: 9.8 mg/dL (ref 8.9–10.3)
Creatinine, Ser: 1.12 mg/dL — ABNORMAL HIGH (ref 0.44–1.00)
GFR, EST AFRICAN AMERICAN: 48 mL/min — AB (ref >60)
GFR, EST NON AFRICAN AMERICAN: 42 mL/min — AB (ref >60)
Glucose, Bld: 126 mg/dL — ABNORMAL HIGH (ref 65–99)
POTASSIUM: 4.1 mmol/L (ref 3.5–5.1)
SODIUM: 136 mmol/L (ref 135–145)

## 2016-06-29 LAB — SURGICAL PCR SCREEN
MRSA, PCR: NEGATIVE
STAPHYLOCOCCUS AUREUS: NEGATIVE

## 2016-06-29 LAB — I-STAT CG4 LACTIC ACID, ED: Lactic Acid, Venous: 1.45 mmol/L (ref 0.5–1.9)

## 2016-06-29 LAB — CK: Total CK: 9443 U/L — ABNORMAL HIGH (ref 38–234)

## 2016-06-29 MED ORDER — GLYCERIN (LAXATIVE) 2.1 G RE SUPP: 1.0000 | Freq: Every day | RECTAL | Status: DC | PRN

## 2016-06-29 MED ORDER — SIMVASTATIN 40 MG PO TABS
40.0000 mg | ORAL_TABLET | Freq: Every day | ORAL | Status: DC
  Administered 2016-06-29 – 2016-07-02 (×4): 40 mg via ORAL
  Filled 2016-06-29 (×4): qty 1

## 2016-06-29 MED ORDER — METHOCARBAMOL 500 MG PO TABS
500.0000 mg | ORAL_TABLET | Freq: Four times a day (QID) | ORAL | Status: DC | PRN
  Administered 2016-07-03: 500 mg via ORAL
  Filled 2016-06-29: qty 1

## 2016-06-29 MED ORDER — HEPARIN SODIUM (PORCINE) 5000 UNIT/ML IJ SOLN: 5000.0000 [IU] | Freq: Three times a day (TID) | INTRAMUSCULAR | Status: AC

## 2016-06-29 MED ORDER — MELOXICAM 7.5 MG PO TABS: 7.5000 mg | ORAL_TABLET | Freq: Two times a day (BID) | ORAL | Status: DC

## 2016-06-29 MED ORDER — DOCUSATE SODIUM 100 MG PO CAPS
100.0000 mg | ORAL_CAPSULE | Freq: Two times a day (BID) | ORAL | Status: DC
  Administered 2016-06-29 – 2016-07-03 (×7): 100 mg via ORAL
  Filled 2016-06-29 (×7): qty 1

## 2016-06-29 MED ORDER — GLYCERIN (LAXATIVE) 2.1 G RE SUPP
1.0000 | Freq: Once | RECTAL | Status: AC
  Administered 2016-06-29: 1 via RECTAL
  Filled 2016-06-29: qty 1

## 2016-06-29 MED ORDER — SODIUM CHLORIDE 0.9 % IV BOLUS (SEPSIS)
1000.0000 mL | Freq: Once | INTRAVENOUS | Status: AC
Start: 2016-06-29 — End: 2016-06-29
  Administered 2016-06-29: 1000 mL via INTRAVENOUS

## 2016-06-29 MED ORDER — SODIUM CHLORIDE 0.9 % IV SOLN
INTRAVENOUS | Status: DC
  Administered 2016-06-29: 12:00:00 via INTRAVENOUS

## 2016-06-29 MED ORDER — METHOCARBAMOL 1000 MG/10ML IJ SOLN: 500.0000 mg | Freq: Four times a day (QID) | INTRAVENOUS | Status: DC | PRN

## 2016-06-29 MED ORDER — SODIUM CHLORIDE 0.9 % IV SOLN
INTRAVENOUS | Status: DC
  Administered 2016-06-29 – 2016-06-30 (×2): via INTRAVENOUS

## 2016-06-29 MED ORDER — MORPHINE SULFATE (PF) 4 MG/ML IV SOLN: 0.5000 mg | INTRAVENOUS | Status: DC | PRN

## 2016-06-29 MED ORDER — HYDROCODONE-ACETAMINOPHEN 5-325 MG PO TABS: 1.0000 | ORAL_TABLET | Freq: Four times a day (QID) | ORAL | Status: DC | PRN

## 2016-06-29 MED ORDER — DOCUSATE SODIUM 100 MG PO CAPS: 100.0000 mg | ORAL_CAPSULE | Freq: Two times a day (BID) | ORAL | Status: DC

## 2016-06-29 MED ORDER — MORPHINE SULFATE (PF) 4 MG/ML IV SOLN
4.0000 mg | Freq: Once | INTRAVENOUS | Status: AC
  Administered 2016-06-29: 4 mg via INTRAVENOUS
  Filled 2016-06-29: qty 1

## 2016-06-29 NOTE — ED Triage Notes (Signed)
Per EMS pt from home lives by self and ambulates with walker. Patient had two known falls yesterday noted by son who lives nearby. This morning patient was found down on floor with right hip shortening. Strong pedal pulses. Patient had surgery on hip last year. Son noted some confusion since yesterday. EMS notes patient alert and oriented x4. Pt. HOH. Pt endorses lower back and hip pain. C-collar put on for precautions.
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
## 2016-06-29 NOTE — ED Notes (Signed)
Pt to CT

## 2016-06-29 NOTE — Progress Notes (Signed)
Female urinal suction device not effective. No urinary output since pt arrival to floor. Bladder scanned for 512 ml.  Straight cathed for of amber clear urine. Specimen forwarded to lab per order.
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
## 2016-06-29 NOTE — Progress Notes (Signed)
Rec'd pt from ED with family  Present. Pt appears comfortable at this time and not offering any description of discomfort. Pureflow in place and connected to 40mm suction. Will continue to monitor.

## 2016-06-29 NOTE — ED Notes (Signed)
Attempted report- no response
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
## 2016-06-29 NOTE — ED Notes (Signed)
Attempted report 

## 2016-06-30 ENCOUNTER — Encounter (HOSPITAL_COMMUNITY): Payer: Self-pay | Admitting: Anesthesiology

## 2016-06-30 ENCOUNTER — Inpatient Hospital Stay (HOSPITAL_COMMUNITY): Payer: Medicare Other

## 2016-06-30 ENCOUNTER — Inpatient Hospital Stay (HOSPITAL_COMMUNITY): Payer: Medicare Other | Admitting: Certified Registered Nurse Anesthetist

## 2016-06-30 ENCOUNTER — Encounter (HOSPITAL_COMMUNITY)
Admission: EM | Disposition: A | Discharge status: Skilled Nursing Facility | Payer: Self-pay | Source: Home / Self Care | Attending: Internal Medicine

## 2016-06-30 DIAGNOSIS — S72001A Fracture of unspecified part of neck of right femur, initial encounter for closed fracture: Secondary | ICD-10-CM

## 2016-06-30 DIAGNOSIS — R748 Abnormal levels of other serum enzymes: Secondary | ICD-10-CM

## 2016-06-30 DIAGNOSIS — I10 Essential (primary) hypertension: Secondary | ICD-10-CM

## 2016-06-30 HISTORY — PX: INTRAMEDULLARY (IM) NAIL INTERTROCHANTERIC: SHX5875

## 2016-06-30 LAB — CBC
HEMATOCRIT: 28.9 % — AB (ref 36.0–46.0)
Hemoglobin: 9.3 g/dL — ABNORMAL LOW (ref 12.0–15.0)
MCH: 29.2 pg (ref 26.0–34.0)
MCHC: 32.2 g/dL (ref 30.0–36.0)
MCV: 90.9 fL (ref 78.0–100.0)
Platelets: 152 10*3/uL (ref 150–400)
RBC: 3.18 MIL/uL — ABNORMAL LOW (ref 3.87–5.11)
RDW: 14.1 % (ref 11.5–15.5)
WBC: 10.9 10*3/uL — AB (ref 4.0–10.5)

## 2016-06-30 LAB — BASIC METABOLIC PANEL
ANION GAP: 6 (ref 5–15)
BUN: 31 mg/dL — AB (ref 6–20)
CALCIUM: 8.4 mg/dL — AB (ref 8.9–10.3)
CO2: 26 mmol/L (ref 22–32)
Chloride: 108 mmol/L (ref 101–111)
Creatinine, Ser: 0.73 mg/dL (ref 0.44–1.00)
GFR calc Af Amer: >60 mL/min (ref >60)
GFR calc non Af Amer: >60 mL/min (ref >60)
GLUCOSE: 95 mg/dL (ref 65–99)
Potassium: 2.9 mmol/L — ABNORMAL LOW (ref 3.5–5.1)
Sodium: 140 mmol/L (ref 135–145)

## 2016-06-30 LAB — URINE CULTURE: CULTURE: NO GROWTH

## 2016-06-30 LAB — CK: CK TOTAL: 2900 U/L — AB (ref 38–234)

## 2016-06-30 LAB — PROTIME-INR
INR: 1.15
Prothrombin Time: 14.8 seconds (ref 11.4–15.2)

## 2016-06-30 SURGERY — FIXATION, FRACTURE, INTERTROCHANTERIC, WITH INTRAMEDULLARY ROD
Anesthesia: General | Site: Leg Upper | Laterality: Right

## 2016-06-30 MED ORDER — HYDROCODONE-ACETAMINOPHEN 5-325 MG PO TABS: 1.0000 | ORAL_TABLET | ORAL | 0 refills | Status: AC | PRN

## 2016-06-30 MED ORDER — ZOLPIDEM TARTRATE 5 MG PO TABS
5.0000 mg | ORAL_TABLET | Freq: Every evening | ORAL | Status: DC | PRN
  Administered 2016-07-01 – 2016-07-02 (×3): 5 mg via ORAL
  Filled 2016-06-30 (×3): qty 1

## 2016-06-30 MED ORDER — METOCLOPRAMIDE HCL 5 MG/ML IJ SOLN: 5.0000 mg | Freq: Three times a day (TID) | INTRAMUSCULAR | Status: DC | PRN

## 2016-06-30 MED ORDER — MENTHOL 3 MG MT LOZG: 1.0000 | LOZENGE | OROMUCOSAL | Status: DC | PRN

## 2016-06-30 MED ORDER — ONDANSETRON HCL 4 MG/2ML IJ SOLN
INTRAMUSCULAR | Status: AC
  Filled 2016-06-30: qty 2

## 2016-06-30 MED ORDER — PROPOFOL 10 MG/ML IV BOLUS
INTRAVENOUS | Status: DC | PRN
  Administered 2016-06-30: 100 mg via INTRAVENOUS

## 2016-06-30 MED ORDER — CEFAZOLIN SODIUM-DEXTROSE 2-4 GM/100ML-% IV SOLN
INTRAVENOUS | Status: AC
  Filled 2016-06-30: qty 100

## 2016-06-30 MED ORDER — FENTANYL CITRATE (PF) 100 MCG/2ML IJ SOLN: 25.0000 ug | INTRAMUSCULAR | Status: DC | PRN

## 2016-06-30 MED ORDER — TRIAMTERENE-HCTZ 37.5-25 MG PO TABS
1.0000 | ORAL_TABLET | Freq: Every day | ORAL | Status: DC
  Administered 2016-06-30 – 2016-07-03 (×4): 1 via ORAL
  Filled 2016-06-30 (×4): qty 1

## 2016-06-30 MED ORDER — ONDANSETRON HCL 4 MG/2ML IJ SOLN: 4.0000 mg | Freq: Four times a day (QID) | INTRAMUSCULAR | Status: DC | PRN

## 2016-06-30 MED ORDER — CEFAZOLIN SODIUM-DEXTROSE 2-4 GM/100ML-% IV SOLN
2.0000 g | Freq: Four times a day (QID) | INTRAVENOUS | Status: AC
  Administered 2016-06-30 (×2): 2 g via INTRAVENOUS
  Filled 2016-06-30 (×3): qty 100

## 2016-06-30 MED ORDER — APIXABAN 2.5 MG PO TABS: 2.5000 mg | ORAL_TABLET | Freq: Two times a day (BID) | ORAL | Status: DC

## 2016-06-30 MED ORDER — SENNA 8.6 MG PO TABS
1.0000 | ORAL_TABLET | Freq: Every day | ORAL | Status: DC
  Administered 2016-06-30 – 2016-07-03 (×4): 8.6 mg via ORAL
  Filled 2016-06-30 (×4): qty 1

## 2016-06-30 MED ORDER — LACTATED RINGERS IV SOLN
INTRAVENOUS | Status: DC | PRN
  Administered 2016-06-30 (×2): via INTRAVENOUS

## 2016-06-30 MED ORDER — MIDAZOLAM HCL 2 MG/2ML IJ SOLN
INTRAMUSCULAR | Status: AC
  Filled 2016-06-30: qty 2

## 2016-06-30 MED ORDER — CEFAZOLIN SODIUM-DEXTROSE 2-3 GM-% IV SOLR
2.0000 g | Freq: Once | INTRAVENOUS | Status: AC
  Administered 2016-06-30: 2 g via INTRAVENOUS
  Filled 2016-06-30: qty 50

## 2016-06-30 MED ORDER — FENTANYL CITRATE (PF) 250 MCG/5ML IJ SOLN
INTRAMUSCULAR | Status: AC
  Filled 2016-06-30: qty 5

## 2016-06-30 MED ORDER — METOCLOPRAMIDE HCL 5 MG PO TABS: 5.0000 mg | ORAL_TABLET | Freq: Three times a day (TID) | ORAL | Status: DC | PRN

## 2016-06-30 MED ORDER — ROCURONIUM BROMIDE 10 MG/ML (PF) SYRINGE
PREFILLED_SYRINGE | INTRAVENOUS | Status: AC
  Filled 2016-06-30: qty 5

## 2016-06-30 MED ORDER — SUGAMMADEX SODIUM 200 MG/2ML IV SOLN
INTRAVENOUS | Status: DC | PRN
  Administered 2016-06-30: 115 mg via INTRAVENOUS

## 2016-06-30 MED ORDER — ONDANSETRON HCL 4 MG/2ML IJ SOLN
INTRAMUSCULAR | Status: DC | PRN
  Administered 2016-06-30: 4 mg via INTRAVENOUS

## 2016-06-30 MED ORDER — ROCURONIUM BROMIDE 100 MG/10ML IV SOLN
INTRAVENOUS | Status: DC | PRN
  Administered 2016-06-30: 40 mg via INTRAVENOUS

## 2016-06-30 MED ORDER — FENTANYL CITRATE (PF) 100 MCG/2ML IJ SOLN
INTRAMUSCULAR | Status: DC | PRN
Start: 2016-06-30 — End: 2016-06-30
  Administered 2016-06-30 (×2): 50 ug via INTRAVENOUS

## 2016-06-30 MED ORDER — 0.9 % SODIUM CHLORIDE (POUR BTL) OPTIME
TOPICAL | Status: DC | PRN
  Administered 2016-06-30: 1000 mL

## 2016-06-30 MED ORDER — APIXABAN 2.5 MG PO TABS: 2.5000 mg | ORAL_TABLET | Freq: Two times a day (BID) | ORAL | 0 refills | Status: DC

## 2016-06-30 MED ORDER — ONDANSETRON HCL 4 MG PO TABS: 4.0000 mg | ORAL_TABLET | Freq: Four times a day (QID) | ORAL | Status: DC | PRN

## 2016-06-30 MED ORDER — LIDOCAINE 2% (20 MG/ML) 5 ML SYRINGE
INTRAMUSCULAR | Status: AC
  Filled 2016-06-30: qty 5

## 2016-06-30 MED ORDER — SENNOSIDES 8.6 MG PO TABS: 1.0000 | ORAL_TABLET | Freq: Every day | ORAL | Status: DC

## 2016-06-30 MED ORDER — LIDOCAINE HCL (CARDIAC) 20 MG/ML IV SOLN
INTRAVENOUS | Status: DC | PRN
  Administered 2016-06-30: 20 mg via INTRAVENOUS

## 2016-06-30 MED ORDER — PHENOL 1.4 % MT LIQD: 1.0000 | OROMUCOSAL | Status: DC | PRN

## 2016-06-30 MED ORDER — PROPOFOL 10 MG/ML IV BOLUS
INTRAVENOUS | Status: AC
Start: 2016-06-30 — End: 2016-06-30
  Filled 2016-06-30: qty 20

## 2016-06-30 MED ORDER — APIXABAN 2.5 MG PO TABS
2.5000 mg | ORAL_TABLET | Freq: Two times a day (BID) | ORAL | Status: DC
  Administered 2016-07-01 – 2016-07-03 (×5): 2.5 mg via ORAL
  Filled 2016-06-30 (×5): qty 1

## 2016-06-30 MED ORDER — SUGAMMADEX SODIUM 200 MG/2ML IV SOLN
INTRAVENOUS | Status: AC
  Filled 2016-06-30: qty 2

## 2016-06-30 MED ORDER — HYDROCODONE-ACETAMINOPHEN 5-325 MG PO TABS
1.0000 | ORAL_TABLET | Freq: Four times a day (QID) | ORAL | Status: DC | PRN
  Administered 2016-06-30: 1 via ORAL
  Administered 2016-07-01: 2 via ORAL
  Administered 2016-07-01: 1 via ORAL
  Administered 2016-07-02 – 2016-07-03 (×4): 2 via ORAL
  Filled 2016-06-30: qty 2
  Filled 2016-06-30 (×2): qty 1
  Filled 2016-06-30 (×3): qty 2
  Filled 2016-06-30 (×2): qty 1

## 2016-06-30 MED ORDER — ACETAMINOPHEN 650 MG RE SUPP: 650.0000 mg | Freq: Four times a day (QID) | RECTAL | Status: DC | PRN

## 2016-06-30 MED ORDER — FLEET ENEMA 7-19 GM/118ML RE ENEM: 1.0000 | ENEMA | Freq: Once | RECTAL | Status: DC | PRN

## 2016-06-30 MED ORDER — METHOCARBAMOL 1000 MG/10ML IJ SOLN: 500.0000 mg | Freq: Four times a day (QID) | INTRAVENOUS | 0 refills | Status: AC | PRN

## 2016-06-30 MED ORDER — ACETAMINOPHEN 325 MG PO TABS
650.0000 mg | ORAL_TABLET | Freq: Four times a day (QID) | ORAL | Status: DC | PRN
  Administered 2016-07-01 (×2): 650 mg via ORAL
  Filled 2016-06-30 (×2): qty 2

## 2016-06-30 MED ORDER — BISACODYL 5 MG PO TBEC
5.0000 mg | DELAYED_RELEASE_TABLET | Freq: Every day | ORAL | Status: DC | PRN
  Administered 2016-07-03: 5 mg via ORAL
  Filled 2016-06-30: qty 1

## 2016-06-30 SURGICAL SUPPLY — 54 items
BENZOIN TINCTURE PRP APPL 2/3 (GAUZE/BANDAGES/DRESSINGS) ×3 IMPLANT
BIT DRILL 4.3MMS DISTAL GRDTED (BIT) ×1 IMPLANT
BIT DRILL LAG SCREW (DRILL) ×1 IMPLANT
BLADE SURG 15 STRL LF DISP TIS (BLADE) ×1 IMPLANT
BLADE SURG 15 STRL SS (BLADE) ×2
BNDG COHESIVE 4X5 TAN NS LF (GAUZE/BANDAGES/DRESSINGS) ×3 IMPLANT
BNDG COHESIVE 6X5 TAN STRL LF (GAUZE/BANDAGES/DRESSINGS) ×6 IMPLANT
BNDG GAUZE ELAST 4 BULKY (GAUZE/BANDAGES/DRESSINGS) ×3 IMPLANT
CLOSURE STERI-STRIP 1/2X4 (GAUZE/BANDAGES/DRESSINGS) ×1
CLSR STERI-STRIP ANTIMIC 1/2X4 (GAUZE/BANDAGES/DRESSINGS) ×2 IMPLANT
COVER PERINEAL POST (MISCELLANEOUS) ×3 IMPLANT
COVER SURGICAL LIGHT HANDLE (MISCELLANEOUS) ×3 IMPLANT
DRAPE HALF SHEET 40X57 (DRAPES) IMPLANT
DRAPE STERI IOBAN 125X83 (DRAPES) ×3 IMPLANT
DRILL 4.3MMS DISTAL GRADUATED (BIT) ×3
DRILL LAG SCREW (DRILL) ×3
DRSG MEPILEX BORDER 4X4 (GAUZE/BANDAGES/DRESSINGS) ×3 IMPLANT
DRSG MEPILEX BORDER 4X8 (GAUZE/BANDAGES/DRESSINGS) ×3 IMPLANT
DRSG PAD ABDOMINAL 8X10 ST (GAUZE/BANDAGES/DRESSINGS) ×6 IMPLANT
DURAPREP 26ML APPLICATOR (WOUND CARE) ×3 IMPLANT
ELECT CAUTERY BLADE 6.4 (BLADE) ×3 IMPLANT
ELECT REM PT RETURN 9FT ADLT (ELECTROSURGICAL) ×3
ELECTRODE REM PT RTRN 9FT ADLT (ELECTROSURGICAL) ×1 IMPLANT
FACESHIELD WRAPAROUND (MASK) IMPLANT
GAUZE SPONGE 4X4 12PLY STRL LF (GAUZE/BANDAGES/DRESSINGS) ×3 IMPLANT
GAUZE XEROFORM 5X9 LF (GAUZE/BANDAGES/DRESSINGS) ×3 IMPLANT
GLOVE BIO SURGEON STRL SZ7.5 (GLOVE) ×3 IMPLANT
GLOVE BIOGEL PI IND STRL 8 (GLOVE) ×1 IMPLANT
GLOVE BIOGEL PI INDICATOR 8 (GLOVE) ×2
GOWN STRL REUS W/ TWL LRG LVL3 (GOWN DISPOSABLE) ×2 IMPLANT
GOWN STRL REUS W/ TWL XL LVL3 (GOWN DISPOSABLE) ×1 IMPLANT
GOWN STRL REUS W/TWL LRG LVL3 (GOWN DISPOSABLE) ×4
GOWN STRL REUS W/TWL XL LVL3 (GOWN DISPOSABLE) ×2
GUIDEPIN 3.2X17.5 THRD DISP (PIN) ×6 IMPLANT
GUIDEWIRE BALL NOSE 80CM (WIRE) ×3 IMPLANT
HFN RH 130 DEG 11MM X 360MM (Orthopedic Implant) ×3 IMPLANT
HIP FRAC NAIL LAG SCR 10.5X100 (Orthopedic Implant) ×2 IMPLANT
KIT BASIN OR (CUSTOM PROCEDURE TRAY) ×3 IMPLANT
KIT ROOM TURNOVER OR (KITS) ×3 IMPLANT
LINER BOOT UNIVERSAL DISP (MISCELLANEOUS) ×3 IMPLANT
MANIFOLD NEPTUNE II (INSTRUMENTS) ×3 IMPLANT
NS IRRIG 1000ML POUR BTL (IV SOLUTION) ×3 IMPLANT
PACK GENERAL/GYN (CUSTOM PROCEDURE TRAY) ×3 IMPLANT
PAD ARMBOARD 7.5X6 YLW CONV (MISCELLANEOUS) ×6 IMPLANT
PAD CAST 4YDX4 CTTN HI CHSV (CAST SUPPLIES) ×2 IMPLANT
PADDING CAST COTTON 4X4 STRL (CAST SUPPLIES) ×4
SCREW BONE CORTICAL 5.0X38 (Screw) ×3 IMPLANT
SCREW CANN THRD AFF 10.5X100 (Orthopedic Implant) ×1 IMPLANT
SCREWDRIVER HEX TIP 3.5MM (MISCELLANEOUS) ×3 IMPLANT
STAPLER VISISTAT 35W (STAPLE) ×3 IMPLANT
SUT MON AB 2-0 CT1 36 (SUTURE) ×3 IMPLANT
TOWEL OR 17X24 6PK STRL BLUE (TOWEL DISPOSABLE) ×3 IMPLANT
TOWEL OR 17X26 10 PK STRL BLUE (TOWEL DISPOSABLE) ×3 IMPLANT
WATER STERILE IRR 1000ML POUR (IV SOLUTION) ×3 IMPLANT

## 2016-06-30 NOTE — Progress Notes (Signed)
Stat Xrays of R knee completed

## 2016-06-30 NOTE — Progress Notes (Signed)
IV Ancef not available from pharmacy to hang at 1600.  Med requested from pharmacy

## 2016-06-30 NOTE — Transfer of Care (Signed)
Immediate Anesthesia Transfer of Care Note  [facility]  Procedure(s) Performed: Procedure(s): INTRAMEDULLARY (IM) NAIL INTERTROCHANTRIC (Right)  Patient Location: PACU  Anesthesia Type:General  Level of Consciousness: awake, oriented and patient cooperative  Airway & Oxygen Therapy: Patient Spontanous Breathing and Patient connected to face mask oxygen  Post-op Assessment: Report given to RN and Post -op Vital signs reviewed and stable  Post vital signs: Reviewed  Last Vitals:  Vitals:   06/30/16 1227 06/30/16 1230  BP:  (!) 96/48  Pulse: 93 93  Resp:  17  Temp:      Last Pain:  Vitals:   06/30/16 1016  TempSrc:   PainSc: 1       Patients Stated Pain Goal: 0 (06/29/16 1500)  Complications: No apparent anesthesia complications

## 2016-06-30 NOTE — Anesthesia Procedure Notes (Addendum)
Procedure Name: Intubation Date/Time: 06/30/2016 10:00 AM Performed by: Lovie CholOCK, Yarethzy Croak K Pre-anesthesia Checklist: Patient identified, Emergency Drugs available, Suction available and Patient being monitored Patient Re-evaluated:Patient Re-evaluated prior to inductionOxygen Delivery Method: Circle System Utilized Preoxygenation: Pre-oxygenation with 100% oxygen Intubation Type: IV induction Ventilation: Mask ventilation without difficulty Laryngoscope Size: Miller and 2 Grade View: Grade I Tube type: Oral Number of attempts: 1 Airway Equipment and Method: Stylet Placement Confirmation: ETT inserted through vocal cords under direct vision,  positive ETCO2 and breath sounds checked- equal and bilateral Secured at: 21 cm Tube secured with: Tape Dental Injury: Teeth and Oropharynx as per pre-operative assessment

## 2016-06-30 NOTE — Progress Notes (Signed)
Patient with ongoing pain in R hip. Pain is a stabbing sensation, increased with any motion of R hip. Pain level is the same. Patient does walk with a walker at baseline, and does live alone.   + pain in R hip with any rotation  R IT hip fracture  - plan is unchanged --> will proceed with closed reduction and IM nail of R hip fracture. Patient is NPO. Patient will return to medicine service with likely transfer to SNF once bed becomes available.   - spoke with patient's son, Reita ClicheBobby, who is fully aware of the plan for surgery and the postoperative plan. He is also aware of risks with surgery including infection, nonunion, malunion and NV injury.

## 2016-06-30 NOTE — Progress Notes (Signed)
Patient has not voided in 12 hours. Last output obtained with in and out cath.  Order already in place for foley insertion.  14 french foley placed prior to going to OR.  400 cc cloudy urine returned.

## 2016-06-30 NOTE — Progress Notes (Signed)
Orthopedic Tech Progress Note Patient Details:  Megan MilchCallie Murillo Oct 23, 1923 [id number]  Patient ID: Megan Milchallie Murillo, female   DOB: Oct 23, 1923, 83 y.o.   MRN: [id number]   Megan DomCrawford, Megan Murillo 06/30/2016, 3:17 PM Pt unable to use trapeze bar paTIENT HELPER; RN notified

## 2016-06-30 NOTE — Progress Notes (Signed)
Patient s/p IM fixation of right hip fracture. Patient tolerated the procedure well. LE postoperative clinical alignment symmetric. Patient currently comfortable.   Patient had R knee swelling noted in PACU, and xrays of right knee were obtained and are normal.   Postop plan is for PT (WBAT), and eliquis 2.5mg  BID x 2 weeks Likely transfer to SNF when bed becomes available My office will call patient for a f/u appt in 2 weeks  I do very much appreciate the medicine team's assistance in this pleasant patient's care

## 2016-06-30 NOTE — Anesthesia Postprocedure Evaluation (Signed)
Anesthesia Post Note  Patient: Megan Murillo  Procedure(s) Performed: Procedure(s) (LRB): INTRAMEDULLARY (IM) NAIL INTERTROCHANTRIC (Right)  Patient location during evaluation: PACU Anesthesia Type: General Level of consciousness: awake and alert, patient cooperative and oriented Pain management: pain level controlled Vital Signs Assessment: post-procedure vital signs reviewed and stable Respiratory status: spontaneous breathing, nonlabored ventilation, respiratory function stable and patient connected to nasal cannula oxygen Cardiovascular status: blood pressure returned to baseline and stable Postop Assessment: no signs of nausea or vomiting Anesthetic complications: no       Last Vitals:  Vitals:   06/30/16 1314 06/30/16 1338  BP: (!) 140/54 (!) 134/52  Pulse: 73 79  Resp: 14 15  Temp:  37.1 C    Last Pain:  Vitals:   06/30/16 1338  TempSrc: Axillary  PainSc:                  Aryaan Persichetti,E. Avaiah Stempel

## 2016-06-30 NOTE — Progress Notes (Signed)
PROGRESS NOTE                                                                                                                                                                                                             Patient Demographics:    Megan Murillo, is a 83 y.o. female, DOB - January 10, 1924, ZOX:[id number]  Admit date - 06/29/2016   Admitting Physician Haydee Salter, MD  Outpatient Primary MD for the patient is Elizabeth Palau, [name]  LOS - 1   Chief Complaint  Patient presents with  . Fall       Brief Narrative   Megan Murillo is a 83 y.o. female with a Past Medical History significant for hx of PE, HTN who presents with R hip pain. Dx - femur fracture. Monitor UOP due to rhabdo.   Subjective:    [facility] today has, No headache, No chest pain, No abdominal pain - Reports a pain much improved after surgery .    Assessment  & Plan :    Principal Problem:   Closed fracture of right hip [facility]) Active Problems:   Essential hypertension   Acute deep vein thrombosis (DVT) of right lower extremity (HCC)   Leukocytosis   Pulmonary embolism (HCC)   Elevated CK   Right hip fracture - Secondary to mechanical fall, orbital consult greatly appreciated, status post IM fixation of right hip 5/25 by Dr. Yevette Edwards. - on Eliquis for DVT prophylaxis. Continue with when necessary pain medication  Leukocytosis - Most likely stress related from fracture, negative chest x-ray, as well no cardiopulmonary disease and chest x-ray  Hypertension - Acceptable, not on any home medication, continue with when necessary hydralazine  Elevated CK/Rhabdomyolysis - Trending down, renal function at baseline, this is most likely related to fracture and muscle injury, continued IV fluids, recheck in a.m.  Frequent falls - PT consulted  History of DVT/Benadryl PE in October 2017 - This is 2 month after her left hip surgery - Related tissue was total of 6  months, appears to be provoked DVT - Currently on Eliquis  2.5 twice a day for DVT prophylaxis postoperatively.    Code Status : Full  Family Communication  : son at bedside  Disposition Plan  : will need SNF  Consults  :  Ortho  Procedures  : /p IM fixation  of right hip fracture 5/27  DVT Prophylaxis  :   SCDs , Eliquis  Lab Results  Component Value Date   PLT 152 06/30/2016    Antibiotics  :   Anti-infectives    Start     Dose/Rate Route Frequency Ordered Stop   06/30/16 1600  ceFAZolin (ANCEF) IVPB 2g/100 mL premix     2 g 200 mL/hr over 30 Minutes Intravenous Every 6 hours 06/30/16 1347 07/01/16 0359   06/30/16 1030  ceFAZolin (ANCEF) IVPB 2 g/50 mL premix     2 g 100 mL/hr over 30 Minutes Intravenous  Once 06/30/16 1023 06/30/16 1018   06/30/16 0935  ceFAZolin (ANCEF) 2-4 GM/100ML-% IVPB    Comments:  Block, Sarah   : cabinet override      06/30/16 0935 06/30/16 2144        Objective:   Vitals:   06/30/16 1259 06/30/16 1300 06/30/16 1314 06/30/16 1338  BP: (!) 133/48  (!) 140/54 (!) 134/52  Pulse: 76 81 73 79  Resp: 19 18 14 15   Temp:  97.9 F (36.6 C)  98.7 F (37.1 C)  TempSrc:    Axillary  SpO2: 96% 94% 97% 98%  Weight:      Height:        Wt Readings from Last 3 Encounters:  06/29/16 56.2 kg (124 lb)  11/23/15 56.5 kg (124 lb 9.6 oz)     Intake/Output Summary (Last 24 hours) at 06/30/16 1529 Last data filed at 06/30/16 1312  Gross per 24 hour  Intake             2800 ml  Output             2050 ml  Net              750 ml     Physical Exam  Awake Alert, Oriented X 3 Symmetrical Chest wall movement, Good air movement bilaterally, CTAB RRR,No Gallops,Rubs or new Murmurs, No Parasternal Heave +ve B.Sounds, Abd Soft, No tenderness,  No rebound - guarding or rigidity. No Cyanosis, Clubbing or edema, No new Rash or bruise      Data Review:    CBC  Recent Labs Lab 06/29/16 1033 06/30/16 0334  WBC 14.7* 10.9*  HGB 12.3 9.3*    HCT 36.8 28.9*  PLT 203 152  MCV 89.1 90.9  MCH 29.8 29.2  MCHC 33.4 32.2  RDW 14.0 14.1  LYMPHSABS 0.8  --   MONOABS 0.9  --   EOSABS 0.0  --   BASOSABS 0.0  --     Chemistries   Recent Labs Lab 06/29/16 1033 06/30/16 0334  NA 136 140  K 4.1 2.9*  CL 101 108  CO2 24 26  GLUCOSE 126* 95  BUN 39* 31*  CREATININE 1.12* 0.73  CALCIUM 9.8 8.4*   ------------------------------------------------------------------------------------------------------------------ No results for input(s): CHOL, HDL, LDLCALC, TRIG, CHOLHDL, LDLDIRECT in the last 72 hours.  No results found for: HGBA1C ------------------------------------------------------------------------------------------------------------------ No results for input(s): TSH, T4TOTAL, T3FREE, THYROIDAB in the last 72 hours.  Invalid input(s): FREET3 ------------------------------------------------------------------------------------------------------------------ No results for input(s): VITAMINB12, FOLATE, FERRITIN, TIBC, IRON, RETICCTPCT in the last 72 hours.  Coagulation profile  Recent Labs Lab 06/29/16 1033 06/30/16 0334  INR 1.08 1.15    No results for input(s): DDIMER in the last 72 hours.  Cardiac Enzymes No results for input(s): CKMB, TROPONINI, MYOGLOBIN in the last 168 hours.  Invalid input(s): CK ------------------------------------------------------------------------------------------------------------------    Component Value Date/Time  BNP 123.9 (H) 11/23/2015 1755    Inpatient Medications  Scheduled Meds: . apixaban  2.5 mg Oral BID  . docusate sodium  100 mg Oral BID  . senna  1 tablet Oral Daily  . simvastatin  40 mg Oral q1800  . triamterene-hydrochlorothiazide  1 tablet Oral Daily   Continuous Infusions: . sodium chloride 100 mL/hr at 06/30/16 0400  . ceFAZolin    .  ceFAZolin (ANCEF) IV    . methocarbamol (ROBAXIN)  IV     PRN Meds:.acetaminophen **OR** acetaminophen, bisacodyl,  Glycerin (Adult), HYDROcodone-acetaminophen, menthol-cetylpyridinium **OR** phenol, methocarbamol **OR** methocarbamol (ROBAXIN)  IV, metoCLOPramide **OR** metoCLOPramide (REGLAN) injection, morphine injection, ondansetron **OR** ondansetron (ZOFRAN) IV, sodium phosphate, zolpidem  Micro Results Recent Results (from the past 240 hour(s))  Surgical pcr screen     Status: None   Collection Time: 06/29/16  2:59 PM  Result Value Ref Range Status   MRSA, PCR NEGATIVE NEGATIVE Final   Staphylococcus aureus NEGATIVE NEGATIVE Final    Comment:        The Xpert SA Assay (FDA approved for NASAL specimens in patients over 37 years of age), is one component of a comprehensive surveillance program.  Test performance has been validated by [facility] for patients greater than or equal to 52 year old. It is not intended to diagnose infection nor to guide or monitor treatment.   Urine culture     Status: None   Collection Time: 06/29/16  7:12 PM  Result Value Ref Range Status   Specimen Description URINE, RANDOM  Final   Special Requests NONE  Final   Culture NO GROWTH  Final   Report Status 06/30/2016 FINAL  Final    Radiology Reports Dg Chest 1 View  Result Date: 06/29/2016 CLINICAL DATA:  Hip fracture, fall EXAM: CHEST 1 VIEW COMPARISON:  CT chest 11/23/2015 FINDINGS: Normal heart size and pulmonary vascularity. Tortuous aorta. Lungs clear. No pleural effusion or pneumothorax. Question LEFT nipple shadow versus pulmonary nodule Bones demineralized. IMPRESSION: No acute abnormalities. Question LEFT nipple shadow ; followup upright chest radiograph with nipple markers recommended when the patient's clinical condition permits to confirm nipple shadow and exclude pulmonary nodule. Electronically Signed   By: Ulyses Southward M.D.   On: 06/29/2016 11:32   Dg Knee 1-2 Views Right  Result Date: 06/30/2016 CLINICAL DATA:  Right knee swelling. EXAM: RIGHT KNEE - 1-2 VIEW COMPARISON:  None. FINDINGS: AP  and lateral views of the right knee are provided. There is prominent degenerative narrowing of the lateral compartment, with associated mild osseous spurring. Medial and patellofemoral compartments appear relatively well preserved. No acute appearing osseous abnormality. Probable small joint effusion within suprapatellar bursa. Intramedullary rod, with associated fixation screws, partially imaged within the distal right femur, with overlying soft tissue staples. IMPRESSION: 1. Degenerative narrowing of the lateral knee compartment, at least moderate in degree, with associated mild osseous spurring. Medial and patellofemoral compartments appear relatively well preserved. 2. No acute appearing osseous abnormality. 3. Status post right hip fixation, with partially imaged intramedullary rod in the distal right femur. Electronically Signed   By: Bary Richard M.D.   On: 06/30/2016 12:53   Ct Head Wo Contrast  Result Date: 06/29/2016 CLINICAL DATA:  Fall yesterday, confusion EXAM: CT HEAD WITHOUT CONTRAST CT CERVICAL SPINE WITHOUT CONTRAST TECHNIQUE: Multidetector CT imaging of the head and cervical spine was performed following the standard protocol without intravenous contrast. Multiplanar CT image reconstructions of the cervical spine were also generated. COMPARISON:  None.  FINDINGS: CT HEAD FINDINGS Brain: No evidence of acute infarction, hemorrhage, hydrocephalus, extra-axial collection or mass lesion/mass effect. Mild cortical atrophy. Mild subcortical white matter and periventricular small vessel ischemic changes. Vascular: Mild intracranial atherosclerosis. Skull: Normal. Negative for fracture or focal lesion. Sinuses/Orbits: The visualized paranasal sinuses are essentially clear. The mastoid air cells are unopacified. Other: None. CT CERVICAL SPINE FINDINGS Alignment: Normal. Skull base and vertebrae: No acute fracture. No primary bone lesion or focal pathologic process. Soft tissues and spinal canal: No  prevertebral fluid or swelling. No visible canal hematoma. Disc levels:  Mild multilevel degenerative changes. Final canal is patent. Upper chest: Visualized lung apices are clear. Other: Visualized thyroid is unremarkable. IMPRESSION: No evidence of acute intracranial abnormality. Atrophy with small vessel ischemic changes. No evidence of traumatic injury to the cervical spine. Mild multilevel degenerative changes. Electronically Signed   By: Charline Bills M.D.   On: 06/29/2016 11:02   Ct Cervical Spine Wo Contrast  Result Date: 06/29/2016 CLINICAL DATA:  Fall yesterday, confusion EXAM: CT HEAD WITHOUT CONTRAST CT CERVICAL SPINE WITHOUT CONTRAST TECHNIQUE: Multidetector CT imaging of the head and cervical spine was performed following the standard protocol without intravenous contrast. Multiplanar CT image reconstructions of the cervical spine were also generated. COMPARISON:  None. FINDINGS: CT HEAD FINDINGS Brain: No evidence of acute infarction, hemorrhage, hydrocephalus, extra-axial collection or mass lesion/mass effect. Mild cortical atrophy. Mild subcortical white matter and periventricular small vessel ischemic changes. Vascular: Mild intracranial atherosclerosis. Skull: Normal. Negative for fracture or focal lesion. Sinuses/Orbits: The visualized paranasal sinuses are essentially clear. The mastoid air cells are unopacified. Other: None. CT CERVICAL SPINE FINDINGS Alignment: Normal. Skull base and vertebrae: No acute fracture. No primary bone lesion or focal pathologic process. Soft tissues and spinal canal: No prevertebral fluid or swelling. No visible canal hematoma. Disc levels:  Mild multilevel degenerative changes. Final canal is patent. Upper chest: Visualized lung apices are clear. Other: Visualized thyroid is unremarkable. IMPRESSION: No evidence of acute intracranial abnormality. Atrophy with small vessel ischemic changes. No evidence of traumatic injury to the cervical spine. Mild  multilevel degenerative changes. Electronically Signed   By: Charline Bills M.D.   On: 06/29/2016 11:02   Dg C-arm 1-60 Min  Result Date: 06/30/2016 CLINICAL DATA:  83 year old female with history of intramedullary rod fixation for right-sided hip fracture. EXAM: OPERATIVE RIGHT HIP (WITH PELVIS IF PERFORMED)  VIEWS TECHNIQUE: Fluoroscopic spot image(s) were submitted for interpretation post-operatively. COMPARISON:  06/29/2016. FINDINGS: Interval reduction of previously noted angulated comminuted intertrochanteric hip fracture. There has been interval placement of a gamma nail fixation device traversing the fracture resulting and restoration of near anatomic alignment. Mild displacement of the lesser trochanteric fracture medially persists. Femoral head is properly located. Surgical staples are noted lateral to the right hip joint and adjacent to the fixation screw in the distal aspect of the femoral diaphysis. IMPRESSION: 1. Expected postoperative findings after gamma nail fixation of right intertrochanteric hip fracture with restoration of near anatomic alignment. Electronically Signed   By: Trudie Reed M.D.   On: 06/30/2016 12:30   Dg Hip Operative Unilat W Or W/o Pelvis Right  Result Date: 06/30/2016 CLINICAL DATA:  83 year old female with history of intramedullary rod fixation for right-sided hip fracture. EXAM: OPERATIVE RIGHT HIP (WITH PELVIS IF PERFORMED)  VIEWS TECHNIQUE: Fluoroscopic spot image(s) were submitted for interpretation post-operatively. COMPARISON:  06/29/2016. FINDINGS: Interval reduction of previously noted angulated comminuted intertrochanteric hip fracture. There has been interval placement of a gamma nail fixation  device traversing the fracture resulting and restoration of near anatomic alignment. Mild displacement of the lesser trochanteric fracture medially persists. Femoral head is properly located. Surgical staples are noted lateral to the right hip joint and adjacent  to the fixation screw in the distal aspect of the femoral diaphysis. IMPRESSION: 1. Expected postoperative findings after gamma nail fixation of right intertrochanteric hip fracture with restoration of near anatomic alignment. Electronically Signed   By: Trudie Reedaniel  Entrikin M.D.   On: 06/30/2016 12:30   Dg Hip Unilat With Pelvis 2-3 Views Right  Result Date: 06/29/2016 CLINICAL DATA:  Larey SeatFell twice yesterday, found on floor this morning with RIGHT hip shortening EXAM: DG HIP (WITH OR WITHOUT PELVIS) 2-3V RIGHT COMPARISON:  09/21/2015 FINDINGS: Diffuse osseous demineralization. Comminuted displaced intertrochanteric fracture proximal RIGHT femur with varus angulation. No dislocation. Prior ORIF LEFT femur secondary to old healed intertrochanteric fracture. BILATERAL hip joint space narrowing. Degenerative disc and facet disease changes at visualized lower lumbar spine. Old avulsion fracture at the RIGHT iliac wing unchanged. Increased stool in rectum. IMPRESSION: Comminuted displaced and angulated intertrochanteric fracture RIGHT femur. Marked osseous demineralization with prior ORIF of an intertrochanteric fracture of the LEFT femur. Electronically Signed   By: Ulyses SouthwardMark  Boles M.D.   On: 06/29/2016 11:31     Julyana Woolverton M.D on 06/30/2016 at 3:29 PM  Between 7am to 7pm - Pager - [phone]  After 7pm go to [email or web address] - password [email or web address]  [facility] -  Office  [phone]

## 2016-06-30 NOTE — Progress Notes (Signed)
Pt has been resting comfortably in bed since arrival back from PACU. Pt has taken 1.5 servings of Ensure. PO meds taken without difficulty. Pt denies any pain.

## 2016-06-30 NOTE — Anesthesia Preprocedure Evaluation (Addendum)
Anesthesia Evaluation  Patient identified by MRN, date of birth, ID band Patient awake    Reviewed: Allergy & Precautions, NPO status , Patient's Chart, lab work & pertinent test results  History of Anesthesia Complications Negative for: history of anesthetic complications  Airway Mallampati: II  TM Distance: >3 FB Neck ROM: Full    Dental  (+) Edentulous Upper, Dental Advisory Given, Poor Dentition, Chipped, Missing   Pulmonary PE (off of eliquis x3 months)   breath sounds clear to auscultation       Cardiovascular hypertension, Pt. on medications (-) angina+ DVT   Rhythm:Regular Rate:Normal     Neuro/Psych negative neurological ROS     GI/Hepatic negative GI ROS, Neg liver ROS,   Endo/Other  negative endocrine ROS  Renal/GU negative Renal ROS     Musculoskeletal   Abdominal   Peds  Hematology  (+) Blood dyscrasia ( Hb 9.3), anemia ,   Anesthesia Other Findings   Reproductive/Obstetrics                         Anesthesia Physical Anesthesia Plan  ASA: III  Anesthesia Plan: General   Post-op Pain Management:    Induction: Intravenous  Airway Management Planned: Oral ETT  Additional Equipment:   Intra-op Plan:   Post-operative Plan: Extubation in OR  Informed Consent: I have reviewed the patients History and Physical, chart, labs and discussed the procedure including the risks, benefits and alternatives for the proposed anesthesia with the patient or authorized representative who has indicated his/her understanding and acceptance.   Dental advisory given and Consent reviewed with POA  Plan Discussed with: CRNA and Surgeon  Anesthesia Plan Comments: (Plan routine monitors, GETA )        Anesthesia Quick Evaluation

## 2016-07-01 DIAGNOSIS — I2699 Other pulmonary embolism without acute cor pulmonale: Secondary | ICD-10-CM

## 2016-07-01 DIAGNOSIS — S72001S Fracture of unspecified part of neck of right femur, sequela: Secondary | ICD-10-CM

## 2016-07-01 LAB — BASIC METABOLIC PANEL
Anion gap: 6 (ref 5–15)
BUN: 29 mg/dL — AB (ref 6–20)
CALCIUM: 8 mg/dL — AB (ref 8.9–10.3)
CO2: 28 mmol/L (ref 22–32)
CREATININE: 0.65 mg/dL (ref 0.44–1.00)
Chloride: 106 mmol/L (ref 101–111)
GFR calc Af Amer: >60 mL/min (ref >60)
GLUCOSE: 105 mg/dL — AB (ref 65–99)
Potassium: 2.9 mmol/L — ABNORMAL LOW (ref 3.5–5.1)
Sodium: 140 mmol/L (ref 135–145)

## 2016-07-01 LAB — CBC
HCT: 23.9 % — ABNORMAL LOW (ref 36.0–46.0)
HEMOGLOBIN: 7.8 g/dL — AB (ref 12.0–15.0)
MCH: 29.7 pg (ref 26.0–34.0)
MCHC: 32.6 g/dL (ref 30.0–36.0)
MCV: 90.9 fL (ref 78.0–100.0)
Platelets: 131 10*3/uL — ABNORMAL LOW (ref 150–400)
RBC: 2.63 MIL/uL — AB (ref 3.87–5.11)
RDW: 14 % (ref 11.5–15.5)
WBC: 8.8 10*3/uL (ref 4.0–10.5)

## 2016-07-01 LAB — PREPARE RBC (CROSSMATCH)

## 2016-07-01 LAB — CK: Total CK: 1062 U/L — ABNORMAL HIGH (ref 38–234)

## 2016-07-01 LAB — GLUCOSE, CAPILLARY: Glucose-Capillary: 122 mg/dL — ABNORMAL HIGH (ref 65–99)

## 2016-07-01 MED ORDER — POTASSIUM CHLORIDE CRYS ER 20 MEQ PO TBCR
40.0000 meq | EXTENDED_RELEASE_TABLET | ORAL | Status: AC
  Administered 2016-07-01 (×3): 40 meq via ORAL
  Filled 2016-07-01 (×3): qty 2

## 2016-07-01 MED ORDER — SODIUM CHLORIDE 0.9 % IV SOLN
Freq: Once | INTRAVENOUS | Status: AC
  Administered 2016-07-01: 09:00:00 via INTRAVENOUS

## 2016-07-01 MED ORDER — ENSURE ENLIVE PO LIQD
237.0000 mL | Freq: Two times a day (BID) | ORAL | Status: DC
  Administered 2016-07-01 – 2016-07-03 (×4): 237 mL via ORAL

## 2016-07-01 NOTE — Progress Notes (Signed)
    Patient doing well PO Day 1 S/P R IM Nail for Hip Fx. Reports pain rather substantial overnight and this morning but much improved now. She has not yet been OOB with PT. Eating well and taking fluids. Denies N/V/D, yes passing gas, denies lightheadedness or fatigue.  Family at bedside.   Physical Exam: Vitals:   07/01/16 0552 07/01/16 1319  BP: (!) 111/51 (!) 120/54  Pulse: 71 76  Resp: 16 16  Temp: 98.6 F (37 C) 97 F (36.1 C)   CBC Latest Ref Rng & Units 07/01/2016 06/30/2016 06/29/2016  WBC 4.0 - 10.5 K/uL 8.8 10.9(H) 14.7(H)  Hemoglobin 12.0 - 15.0 g/dL 7.8(L) 9.3(L) 12.3  Hematocrit 36.0 - 46.0 % 23.9(L) 28.9(L) 36.8  Platelets 150 - 400 K/uL 131(L) 152 203   BMP Latest Ref Rng & Units 07/01/2016 06/30/2016 06/29/2016  Glucose 65 - 99 mg/dL 161(W105(H) 95 960(A126(H)  BUN 6 - 20 mg/dL 54(U29(H) 98(J31(H) 19(J39(H)  Creatinine 0.44 - 1.00 mg/dL 4.780.65 2.950.73 6.21(H1.12(H)  Sodium 135 - 145 mmol/L 140 140 136  Potassium 3.5 - 5.1 mmol/L 2.9(L) 2.9(L) 4.1  Chloride 101 - 111 mmol/L 106 108 101  CO2 22 - 32 mmol/L 28 26 24   Calcium 8.9 - 10.3 mg/dL 8.0(L) 8.4(L) 9.8    Dressings in place CDI, distal compartments soft, sensation intact, SCD's in place NVI  POD #1 s/p Right IM nail fixation for intertrochanteric hip fx, doing well  - up with PT/OT, encourage ambulation  -WBAT - Eliquis 2.5mg  BID X 2 weeks for DVT prophylaxis  - Norco for pain, robaxin for muscle spasms - likely d/c SNF pending bed available, has not been seen by social work yet - F/U in office 2 weeks   - Medical service primary team, we appreciate their input and service

## 2016-07-01 NOTE — Progress Notes (Signed)
PROGRESS NOTE                                                                                                                                                                                                             Patient Demographics:    Megan Murillo, is a 83 y.o. female, DOB - August 27, 1923, NFA:[id number]  Admit date - 06/29/2016   Admitting Physician Haydee Salter, MD  Outpatient Primary MD for the patient is Elizabeth Palau, [name]  LOS - 2   Chief Complaint  Patient presents with  . Fall       Brief Narrative   Megan Murillo is a 83 y.o. female with a Past Medical History significant for hx of PE, [name].   Subjective:    [facility] today Reports she is feeling better, pain is controlled, no chest pain, no shortness of breath .    Assessment  & Plan :    Principal Problem:   Closed fracture of right hip [employer]) Active Problems:   Essential hypertension   Acute deep vein thrombosis (DVT) of right lower extremity (HCC)   Leukocytosis   Pulmonary embolism (HCC)   Elevated CK   Right hip fracture - Secondary to mechanical fall, orbital consult greatly appreciated, status post IM fixation of right hip 5/25 by Dr. Yevette Edwards. - on Eliquis for DVT prophylaxis. Continue with when necessary pain medication  Leukocytosis - Most likely stress related from fracture, negative chest x-ray, as well no cardiopulmonary disease and chest x-ray, normalized today  Hypertension - Acceptable, not on any home medication, continue with when necessary hydralazine  Rhabdomyolysis - Trending down, renal function at baseline, this is most likely related to fracture and muscle injury, continued IV fluids, total CK trending down   Frequent falls - PT consulted  Acute blood loss anemia - Postoperative, hemoglobin is 7.8 today, will transfuse 2 units PRBC  Hypokalemia -  Potassium is 2.9 today, will replete, and will recheck in a.m.  History of DVT/Benadryl PE in October 2017 - This is 2 month after her left hip surgery - Related tissue was total of 6 months, appears to be provoked DVT - Currently on Eliquis  2.5 twice a day for DVT prophylaxis postoperatively.    Code Status :  Full  Family Communication  : None at bedside  Disposition Plan  : will need SNF  Consults  :  Ortho  Procedures  : S/p IM fixation of right hip fracture 5/27  DVT Prophylaxis  :   SCDs , Eliquis  Lab Results  Component Value Date   PLT 131 (L) 07/01/2016    Antibiotics  :   Anti-infectives    Start     Dose/Rate Route Frequency Ordered Stop   06/30/16 1600  ceFAZolin (ANCEF) IVPB 2g/100 mL premix     2 g 200 mL/hr over 30 Minutes Intravenous Every 6 hours 06/30/16 1347 06/30/16 2259   06/30/16 1030  ceFAZolin (ANCEF) IVPB 2 g/50 mL premix     2 g 100 mL/hr over 30 Minutes Intravenous  Once 06/30/16 1023 06/30/16 1018   06/30/16 0935  ceFAZolin (ANCEF) 2-4 GM/100ML-% IVPB    Comments:  Block, Sarah   : cabinet override      06/30/16 0935 06/30/16 2144        Objective:   Vitals:   06/30/16 1338 06/30/16 2135 07/01/16 0030 07/01/16 0552  BP: (!) 134/52 (!) 106/58 (!) 115/46 (!) 111/51  Pulse: 79 92 72 71  Resp: 15 16 16 16   Temp: 98.7 F (37.1 C) 99.2 F (37.3 C) 98.8 F (37.1 C) 98.6 F (37 C)  TempSrc: Axillary Oral Oral Oral  SpO2: 98% 96% 99% 96%  Weight:      Height:        Wt Readings from Last 3 Encounters:  06/29/16 56.2 kg (124 lb)  11/23/15 56.5 kg (124 lb 9.6 oz)     Intake/Output Summary (Last 24 hours) at 07/01/16 1241 Last data filed at 07/01/16 1015  Gross per 24 hour  Intake             3444 ml  Output              900 ml  Net             2544 ml     Physical Exam  Awake alert oriented 3 Good entry bilaterally, clear to auscultation, negative accessory muscle RRR,No Gallops,Rubs or new Murmurs, No Parasternal  Heave Abdomen soft, nontender, nondistended, bowel sounds present No Cyanosis, Clubbing ,No new Rash or bruise      Data Review:    CBC  Recent Labs Lab 06/29/16 1033 06/30/16 0334 07/01/16 0544  WBC 14.7* 10.9* 8.8  HGB 12.3 9.3* 7.8*  HCT 36.8 28.9* 23.9*  PLT 203 152 131*  MCV 89.1 90.9 90.9  MCH 29.8 29.2 29.7  MCHC 33.4 32.2 32.6  RDW 14.0 14.1 14.0  LYMPHSABS 0.8  --   --   MONOABS 0.9  --   --   EOSABS 0.0  --   --   BASOSABS 0.0  --   --     Chemistries   Recent Labs Lab 06/29/16 1033 06/30/16 0334 07/01/16 0544  NA 136 140 140  K 4.1 2.9* 2.9*  CL 101 108 106  CO2 24 26 28   GLUCOSE 126* 95 105*  BUN 39* 31* 29*  CREATININE 1.12* 0.73 0.65  CALCIUM 9.8 8.4* 8.0*   ------------------------------------------------------------------------------------------------------------------ No results for input(s): CHOL, HDL, LDLCALC, TRIG, CHOLHDL, LDLDIRECT in the last 72 hours.  No results found for: HGBA1C ------------------------------------------------------------------------------------------------------------------ No results for input(s): TSH, T4TOTAL, T3FREE, THYROIDAB in the last 72 hours.  Invalid input(s): FREET3 ------------------------------------------------------------------------------------------------------------------ No results for input(s): VITAMINB12, FOLATE, FERRITIN, TIBC, IRON, RETICCTPCT  in the last 72 hours.  Coagulation profile  Recent Labs Lab 06/29/16 1033 06/30/16 0334  INR 1.08 1.15    No results for input(s): DDIMER in the last 72 hours.  Cardiac Enzymes No results for input(s): CKMB, TROPONINI, MYOGLOBIN in the last 168 hours.  Invalid input(s): CK ------------------------------------------------------------------------------------------------------------------    Component Value Date/Time   BNP 123.9 (H) 11/23/2015 1755    Inpatient Medications  Scheduled Meds: . apixaban  2.5 mg Oral BID  . docusate  sodium  100 mg Oral BID  . potassium chloride  40 mEq Oral Q4H  . senna  1 tablet Oral Daily  . simvastatin  40 mg Oral q1800  . triamterene-hydrochlorothiazide  1 tablet Oral Daily   Continuous Infusions: . sodium chloride 100 mL/hr at 07/01/16 0746  . methocarbamol (ROBAXIN)  IV     PRN Meds:.acetaminophen **OR** acetaminophen, bisacodyl, Glycerin (Adult), HYDROcodone-acetaminophen, menthol-cetylpyridinium **OR** phenol, methocarbamol **OR** methocarbamol (ROBAXIN)  IV, metoCLOPramide **OR** metoCLOPramide (REGLAN) injection, morphine injection, ondansetron **OR** ondansetron (ZOFRAN) IV, sodium phosphate, zolpidem  Micro Results Recent Results (from the past 240 hour(s))  Surgical pcr screen     Status: None   Collection Time: 06/29/16  2:59 PM  Result Value Ref Range Status   MRSA, PCR NEGATIVE NEGATIVE Final   Staphylococcus aureus NEGATIVE NEGATIVE Final    Comment:        The Xpert SA Assay (FDA approved for NASAL specimens in patients over 40 years of age), is one component of a comprehensive surveillance program.  Test performance has been validated by [facility] for patients greater than or equal to 78 year old. It is not intended to diagnose infection nor to guide or monitor treatment.   Urine culture     Status: None   Collection Time: 06/29/16  7:12 PM  Result Value Ref Range Status   Specimen Description URINE, RANDOM  Final   Special Requests NONE  Final   Culture NO GROWTH  Final   Report Status 06/30/2016 FINAL  Final    Radiology Reports Dg Chest 1 View  Result Date: 06/29/2016 CLINICAL DATA:  Hip fracture, fall EXAM: CHEST 1 VIEW COMPARISON:  CT chest 11/23/2015 FINDINGS: Normal heart size and pulmonary vascularity. Tortuous aorta. Lungs clear. No pleural effusion or pneumothorax. Question LEFT nipple shadow versus pulmonary nodule Bones demineralized. IMPRESSION: No acute abnormalities. Question LEFT nipple shadow ; followup upright chest radiograph  with nipple markers recommended when the patient's clinical condition permits to confirm nipple shadow and exclude pulmonary nodule. Electronically Signed   By: Ulyses Southward M.D.   On: 06/29/2016 11:32   Dg Knee 1-2 Views Right  Result Date: 06/30/2016 CLINICAL DATA:  Right knee swelling. EXAM: RIGHT KNEE - 1-2 VIEW COMPARISON:  None. FINDINGS: AP and lateral views of the right knee are provided. There is prominent degenerative narrowing of the lateral compartment, with associated mild osseous spurring. Medial and patellofemoral compartments appear relatively well preserved. No acute appearing osseous abnormality. Probable small joint effusion within suprapatellar bursa. Intramedullary rod, with associated fixation screws, partially imaged within the distal right femur, with overlying soft tissue staples. IMPRESSION: 1. Degenerative narrowing of the lateral knee compartment, at least moderate in degree, with associated mild osseous spurring. Medial and patellofemoral compartments appear relatively well preserved. 2. No acute appearing osseous abnormality. 3. Status post right hip fixation, with partially imaged intramedullary rod in the distal right femur. Electronically Signed   By: Bary Richard M.D.   On: 06/30/2016 12:53  Ct Head Wo Contrast  Result Date: 06/29/2016 CLINICAL DATA:  Fall yesterday, confusion EXAM: CT HEAD WITHOUT CONTRAST CT CERVICAL SPINE WITHOUT CONTRAST TECHNIQUE: Multidetector CT imaging of the head and cervical spine was performed following the standard protocol without intravenous contrast. Multiplanar CT image reconstructions of the cervical spine were also generated. COMPARISON:  None. FINDINGS: CT HEAD FINDINGS Brain: No evidence of acute infarction, hemorrhage, hydrocephalus, extra-axial collection or mass lesion/mass effect. Mild cortical atrophy. Mild subcortical white matter and periventricular small vessel ischemic changes. Vascular: Mild intracranial atherosclerosis. Skull:  Normal. Negative for fracture or focal lesion. Sinuses/Orbits: The visualized paranasal sinuses are essentially clear. The mastoid air cells are unopacified. Other: None. CT CERVICAL SPINE FINDINGS Alignment: Normal. Skull base and vertebrae: No acute fracture. No primary bone lesion or focal pathologic process. Soft tissues and spinal canal: No prevertebral fluid or swelling. No visible canal hematoma. Disc levels:  Mild multilevel degenerative changes. Final canal is patent. Upper chest: Visualized lung apices are clear. Other: Visualized thyroid is unremarkable. IMPRESSION: No evidence of acute intracranial abnormality. Atrophy with small vessel ischemic changes. No evidence of traumatic injury to the cervical spine. Mild multilevel degenerative changes. Electronically Signed   By: Charline Bills M.D.   On: 06/29/2016 11:02   Ct Cervical Spine Wo Contrast  Result Date: 06/29/2016 CLINICAL DATA:  Fall yesterday, confusion EXAM: CT HEAD WITHOUT CONTRAST CT CERVICAL SPINE WITHOUT CONTRAST TECHNIQUE: Multidetector CT imaging of the head and cervical spine was performed following the standard protocol without intravenous contrast. Multiplanar CT image reconstructions of the cervical spine were also generated. COMPARISON:  None. FINDINGS: CT HEAD FINDINGS Brain: No evidence of acute infarction, hemorrhage, hydrocephalus, extra-axial collection or mass lesion/mass effect. Mild cortical atrophy. Mild subcortical white matter and periventricular small vessel ischemic changes. Vascular: Mild intracranial atherosclerosis. Skull: Normal. Negative for fracture or focal lesion. Sinuses/Orbits: The visualized paranasal sinuses are essentially clear. The mastoid air cells are unopacified. Other: None. CT CERVICAL SPINE FINDINGS Alignment: Normal. Skull base and vertebrae: No acute fracture. No primary bone lesion or focal pathologic process. Soft tissues and spinal canal: No prevertebral fluid or swelling. No visible  canal hematoma. Disc levels:  Mild multilevel degenerative changes. Final canal is patent. Upper chest: Visualized lung apices are clear. Other: Visualized thyroid is unremarkable. IMPRESSION: No evidence of acute intracranial abnormality. Atrophy with small vessel ischemic changes. No evidence of traumatic injury to the cervical spine. Mild multilevel degenerative changes. Electronically Signed   By: Charline Bills M.D.   On: 06/29/2016 11:02   Dg C-arm 1-60 Min  Result Date: 06/30/2016 CLINICAL DATA:  83 year old female with history of intramedullary rod fixation for right-sided hip fracture. EXAM: OPERATIVE RIGHT HIP (WITH PELVIS IF PERFORMED)  VIEWS TECHNIQUE: Fluoroscopic spot image(s) were submitted for interpretation post-operatively. COMPARISON:  06/29/2016. FINDINGS: Interval reduction of previously noted angulated comminuted intertrochanteric hip fracture. There has been interval placement of a gamma nail fixation device traversing the fracture resulting and restoration of near anatomic alignment. Mild displacement of the lesser trochanteric fracture medially persists. Femoral head is properly located. Surgical staples are noted lateral to the right hip joint and adjacent to the fixation screw in the distal aspect of the femoral diaphysis. IMPRESSION: 1. Expected postoperative findings after gamma nail fixation of right intertrochanteric hip fracture with restoration of near anatomic alignment. Electronically Signed   By: Trudie Reed M.D.   On: 06/30/2016 12:30   Dg Hip Operative Unilat W Or W/o Pelvis Right  Result Date: 06/30/2016 CLINICAL  DATA:  83 year old female with history of intramedullary rod fixation for right-sided hip fracture. EXAM: OPERATIVE RIGHT HIP (WITH PELVIS IF PERFORMED)  VIEWS TECHNIQUE: Fluoroscopic spot image(s) were submitted for interpretation post-operatively. COMPARISON:  06/29/2016. FINDINGS: Interval reduction of previously noted angulated comminuted  intertrochanteric hip fracture. There has been interval placement of a gamma nail fixation device traversing the fracture resulting and restoration of near anatomic alignment. Mild displacement of the lesser trochanteric fracture medially persists. Femoral head is properly located. Surgical staples are noted lateral to the right hip joint and adjacent to the fixation screw in the distal aspect of the femoral diaphysis. IMPRESSION: 1. Expected postoperative findings after gamma nail fixation of right intertrochanteric hip fracture with restoration of near anatomic alignment. Electronically Signed   By: Trudie Reed M.D.   On: 06/30/2016 12:30   Dg Hip Unilat With Pelvis 2-3 Views Right  Result Date: 06/29/2016 CLINICAL DATA:  Larey Seat twice yesterday, found on floor this morning with RIGHT hip shortening EXAM: DG HIP (WITH OR WITHOUT PELVIS) 2-3V RIGHT COMPARISON:  09/21/2015 FINDINGS: Diffuse osseous demineralization. Comminuted displaced intertrochanteric fracture proximal RIGHT femur with varus angulation. No dislocation. Prior ORIF LEFT femur secondary to old healed intertrochanteric fracture. BILATERAL hip joint space narrowing. Degenerative disc and facet disease changes at visualized lower lumbar spine. Old avulsion fracture at the RIGHT iliac wing unchanged. Increased stool in rectum. IMPRESSION: Comminuted displaced and angulated intertrochanteric fracture RIGHT femur. Marked osseous demineralization with prior ORIF of an intertrochanteric fracture of the LEFT femur. Electronically Signed   By: Ulyses Southward M.D.   On: 06/29/2016 11:31     Jacolby Risby M.D on 07/01/2016 at 12:41 PM  Between 7am to 7pm - Pager - [phone]  After 7pm go to [email or web address] - password [facility]  [facility] -  Office  [phone]

## 2016-07-01 NOTE — Evaluation (Signed)
Occupational Therapy Evaluation Patient Details Name: Megan MilchCallie Vecchione MRN: [id number] DOB: 12/21/1923 Today's Date: 07/01/2016    History of Present Illness 83 yo female presenting with R hip pain after mechanical fall, s/p R intratrochanteric IM nailing PMH is significant for bilateral PE, HTN, DVT, leukocytosis and vertigo.    Clinical Impression   PTA, pt was living alone and son assisted with ADLs and IADLs. Currently, requires Min A for grooming while seated and Max A +2 for LB ADLs. PT requires Max A +2 to transfer from EOB to recliner. Pt would benefit from acute TO to optimize her occupational performance and participation. Recommend dc to SNF for further OT to increase safety and independence in ADLs and functional mobility.     Follow Up Recommendations  SNF;Supervision/Assistance - 24 hour    Equipment Recommendations  Other (comment) (Defer to next venue)    Recommendations for Other Services PT consult     Precautions / Restrictions Precautions Precautions: Fall Precaution Comments: fell in kitchen when leg gave way Restrictions Weight Bearing Restrictions: Yes RLE Weight Bearing: Weight bearing as tolerated      Mobility Bed Mobility Overal bed mobility: Needs Assistance Bed Mobility: Supine to Sit     Supine to sit: Max assist;+2 for physical assistance     General bed mobility comments: pt required maxA for trunk to upright and LE off bed  Transfers Overall transfer level: Needs assistance Equipment used: Rolling walker (2 wheeled) Transfers: Stand Pivot Transfers   Stand pivot transfers: Max assist;+2 physical assistance       General transfer comment: maxA for power up and steadying at upright as well as to pivot on L LE to sit in recliner     Balance Overall balance assessment: Needs assistance Sitting-balance support: Bilateral upper extremity supported;Feet supported Sitting balance-Leahy Scale: Poor Sitting balance - Comments: pt with  excessive posterior lean and difficulty bending knees to support herself with flat feet Postural control: Posterior lean Standing balance support: Bilateral upper extremity supported Standing balance-Leahy Scale: Zero                             ADL either performed or assessed with clinical judgement   ADL Overall ADL's : Needs assistance/impaired Eating/Feeding: Set up;Sitting   Grooming: Sitting;Minimal assistance Grooming Details (indicate cue type and reason): Demonstrated poor sitting balance at EOB Upper Body Bathing: Minimal assistance;Sitting   Lower Body Bathing: Maximal assistance;Sit to/from stand;+2 for physical assistance   Upper Body Dressing : Minimal assistance;Sitting   Lower Body Dressing: Maximal assistance;+2 for physical assistance;Sit to/from stand   Toilet Transfer: Maximal assistance;+2 for physical assistance;Stand-pivot;RW (simulated to recliner)           Functional mobility during ADLs: Maximal assistance;+2 for physical assistance;Rolling walker General ADL Comments: Pt limited by decreased balance in sitting and standing.      Vision         Perception     Praxis      Pertinent Vitals/Pain Pain Assessment: No/denies pain Pain Score: 7  Pain Location: R hip  Pain Descriptors / Indicators: Grimacing;Guarding;Throbbing Pain Intervention(s): Limited activity within patient's tolerance     Hand Dominance     Extremity/Trunk Assessment Upper Extremity Assessment Upper Extremity Assessment: Generalized weakness   Lower Extremity Assessment Lower Extremity Assessment: Defer to PT evaluation RLE Deficits / Details: R hip and knee ROM and strength limited by surgical pain  RLE: Unable to fully assess due  to pain   Cervical / Trunk Assessment Cervical / Trunk Assessment: Other exceptions (scoliotic)   Communication Communication Communication: HOH   Cognition Arousal/Alertness: Awake/alert Behavior During Therapy: WFL  for tasks assessed/performed Overall Cognitive Status: Within Functional Limits for tasks assessed                                     General Comments  Pt very motivated to participate in therapy    Exercises     Shoulder Instructions      Home Living Family/patient expects to be discharged to:: Private residence Living Arrangements: Alone   Type of Home: Apartment Home Access: Level entry     Home Layout: One level     Bathroom Shower/Tub: Chief Strategy Officer: Handicapped height (toilet riser) Bathroom Accessibility: Yes   Home Equipment: Walker - 2 wheels;Cane - single point;Grab bars - tub/shower;Grab bars - toilet;Bedside commode (3 in 1, sock aid, )          Prior Functioning/Environment Level of Independence: Needs assistance  Gait / Transfers Assistance Needed: ambulating with RW ADL's / Homemaking Assistance Needed: assist with bathing and dressing, housekeeping and meal prep   Comments: pt reports falling once since Christmas but noted in chart by family members that she has fallen repeatedly in the last week        OT Problem List: Decreased strength;Decreased range of motion;Decreased activity tolerance;Impaired balance (sitting and/or standing);Decreased safety awareness;Decreased knowledge of use of DME or AE;Decreased knowledge of precautions;Pain      OT Treatment/Interventions: Self-care/ADL training;Therapeutic exercise;Energy conservation;DME and/or AE instruction;Therapeutic activities;Patient/family education    OT Goals(Current goals can be found in the care plan section) Acute Rehab OT Goals Patient Stated Goal: Stop pain OT Goal Formulation: With patient Time For Goal Achievement: 07/15/16 Potential to Achieve Goals: Good ADL Goals Pt Will Perform Grooming: with min guard assist;sitting Pt Will Perform Upper Body Dressing: with set-up;with supervision;sitting Pt Will Perform Lower Body Dressing: with mod  assist;sit to/from stand;with adaptive equipment Pt Will Transfer to Toilet: with mod assist;ambulating;bedside commode Pt Will Perform Toileting - Clothing Manipulation and hygiene: with mod assist;sit to/from stand  OT Frequency: Min 2X/week   Barriers to D/C:            Co-evaluation PT/OT/SLP Co-Evaluation/Treatment: Yes Reason for Co-Treatment: To address functional/ADL transfers PT goals addressed during session: Mobility/safety with mobility OT goals addressed during session: ADL's and self-care      AM-PAC PT "6 Clicks" Daily Activity     Outcome Measure Help from another person eating meals?: None Help from another person taking care of personal grooming?: A Little Help from another person toileting, which includes using toliet, bedpan, or urinal?: A Lot Help from another person bathing (including washing, rinsing, drying)?: A Lot Help from another person to put on and taking off regular upper body clothing?: A Little Help from another person to put on and taking off regular lower body clothing?: A Lot 6 Click Score: 16   End of Session Equipment Utilized During Treatment: Gait belt;Rolling walker Nurse Communication: Mobility status  Activity Tolerance: Patient limited by fatigue;Patient limited by pain;Patient tolerated treatment well Patient left: in chair;with call bell/phone within reach;with chair alarm set;with family/visitor present  OT Visit Diagnosis: Unsteadiness on feet (R26.81);Other abnormalities of gait and mobility (R26.89);Muscle weakness (generalized) (M62.81);History of falling (Z91.81);Repeated falls (R29.6);Pain Pain - Right/Left: Right Pain - part  of body: Hip;Leg                Time: 1610-9604 OT Time Calculation (min): 28 min Charges:  OT General Charges $OT Visit: 1 Procedure OT Evaluation $OT Eval Low Complexity: 1 Procedure G-Codes:     Alyra Patty, OTR/L [phone]  Theodoro Grist Aerith Canal 07/01/2016, 5:41 PM

## 2016-07-01 NOTE — Progress Notes (Signed)
Initial Nutrition Assessment  DOCUMENTATION CODES:   Not applicable  INTERVENTION:   -Ensure Enlive po BID, each supplement provides 350 kcal and 20 grams of protein -If po intake inadequate, recommend liberalizing diet to Regular -Reinforced importance of adequate nutrition s/p hip fracture  NUTRITION DIAGNOSIS:   Inadequate oral intake related to acute illness, poor appetite, nausea as evidenced by meal completion < 50%.  GOAL:   Patient will meet greater than or equal to 90% of their needs  MONITOR:   PO intake, Supplement acceptance, Labs, Weight trends  REASON FOR ASSESSMENT:   Consult Assessment of nutrition requirement/status  ASSESSMENT:   83 yo female admitted with right hip  fracture. Pt with hx of HTN, PE  5/27 IM fixation of right hip fracture; noted pt with left hip fracture in 09/2015 with stay at rehab after discharge. Pt reports at rehab pt did not eat well due to not liking the food.   Pt reports she ate at least 50% at breakfast this AM, appetite fair this AM, c/o nausea due to pain meds. Recorded po intake 35% of meals on average. Pt reports very good appetite prior to admission eating 3 meals per day with snacks in between.   Wt Readings from Last 10 Encounters:  06/29/16 124 lb (56.2 kg)  11/23/15 124 lb 9.6 oz (56.5 kg)   Labs: potassium 2.9 (supplemented) Meds: NS at 100 ml/hr  Diet Order:  Diet Heart Room service appropriate? Yes; Fluid consistency: Thin  Skin:  Reviewed, no issues (surgical incision only)  Last BM:  no documented BM  Height:   Ht Readings from Last 1 Encounters:  06/29/16 5\' 4"  (1.626 m)    Weight:   Wt Readings from Last 1 Encounters:  06/29/16 124 lb (56.2 kg)    Ideal Body Weight:     BMI:  Body mass index is 21.28 kg/m.  Estimated Nutritional Needs:   Kcal:  1350-1550 kcals  Protein:  68-78 g  Fluid:  >/= 1.4 L  EDUCATION NEEDS:   No education needs identified at this time  Romelle StarcherCate Anupama Piehl MS, RD,  LDN [phone] Pager  [phone] Weekend/On-Call Pager

## 2016-07-01 NOTE — Evaluation (Signed)
Physical Therapy Evaluation Patient Details Name: Megan MilchCallie Murillo MRN: [id number] DOB: 1923-05-04 Today's Date: 07/01/2016   History of Present Illness  83 yo female presenting with R hip pain after mechanical fall, s/p R intratrochanteric IM nailing PMH is significant for bilateral PE, HTN, DVT, leukocytosis and vertigo.   Clinical Impression  Patient is s/p above surgery resulting in functional limitations due to the deficits listed below (see PT Problem List). Pt is currently maxAx2 for bed mobility and stand pivot transfer from bed to recliner using RW.  Patient will benefit from skilled PT to increase their independence and safety with mobility to allow discharge to the venue listed below.       Follow Up Recommendations SNF;Supervision/Assistance - 24 hour    Equipment Recommendations   (to be determined at next venue)    Recommendations for Other Services       Precautions / Restrictions Precautions Precautions: Fall Precaution Comments: fell in kitchen when leg gave way Restrictions Weight Bearing Restrictions: Yes RLE Weight Bearing: Weight bearing as tolerated      Mobility  Bed Mobility Overal bed mobility: Needs Assistance Bed Mobility: Supine to Sit     Supine to sit: Max assist;+2 for physical assistance     General bed mobility comments: pt required maxA for trunk to upright and LE off bed  Transfers Overall transfer level: Needs assistance Equipment used: Rolling walker (2 wheeled) Transfers: Stand Pivot Transfers   Stand pivot transfers: Max assist;+2 physical assistance       General transfer comment: maxA for power up and steadying at upright as well as to pivot on L LE to sit in recliner         Balance Overall balance assessment: Needs assistance Sitting-balance support: Bilateral upper extremity supported;Feet supported Sitting balance-Leahy Scale: Poor Sitting balance - Comments: pt with excessive posterior lean and difficulty bending  knees to support herself with flat feet Postural control: Posterior lean Standing balance support: Bilateral upper extremity supported Standing balance-Leahy Scale: Zero                               Pertinent Vitals/Pain Pain Assessment: 0-10 Pain Score: 7  Pain Location: R hip  Pain Descriptors / Indicators: Grimacing;Guarding;Throbbing Pain Intervention(s): Limited activity within patient's tolerance;Monitored during session;Repositioned  VSS    Home Living Family/patient expects to be discharged to:: Private residence Living Arrangements: Alone   Type of Home: Apartment Home Access: Level entry     Home Layout: One level Home Equipment: Walker - 2 wheels;Cane - single point;Grab bars - tub/shower;Grab bars - toilet;Bedside commode (3 in 1, sock aid, )      Prior Function Level of Independence: Needs assistance   Gait / Transfers Assistance Needed: ambulating with RW  ADL's / Homemaking Assistance Needed: assist with bathing and dressing, housekeeping and meal prep  Comments: pt reports falling once since Christmas but noted in chart by family members that she has fallen repeatedly in the last week        Extremity/Trunk Assessment   Upper Extremity Assessment Upper Extremity Assessment: Defer to OT evaluation    Lower Extremity Assessment Lower Extremity Assessment: RLE deficits/detail RLE Deficits / Details: R hip and knee ROM and strength limited by surgical pain  RLE: Unable to fully assess due to pain    Cervical / Trunk Assessment Cervical / Trunk Assessment: Other exceptions (scoliotic)  Communication   Communication: HOH  Cognition Arousal/Alertness:  Awake/alert Behavior During Therapy: WFL for tasks assessed/performed Overall Cognitive Status: Within Functional Limits for tasks assessed                                               Assessment/Plan    PT Assessment Patient needs continued PT services  PT Problem  List Decreased strength;Decreased range of motion;Decreased activity tolerance;Decreased balance;Decreased mobility;Decreased knowledge of use of DME;Decreased safety awareness;Decreased knowledge of precautions;Pain       PT Treatment Interventions DME instruction;Gait training;Functional mobility training;Therapeutic activities;Therapeutic exercise;Balance training;Patient/family education    PT Goals (Current goals can be found in the Care Plan section)  Acute Rehab PT Goals PT Goal Formulation: With patient Time For Goal Achievement: 07/15/16 Potential to Achieve Goals: Fair    Frequency Min 3X/week   Barriers to discharge Decreased caregiver support      Co-evaluation PT/OT/SLP Co-Evaluation/Treatment: Yes Reason for Co-Treatment: To address functional/ADL transfers PT goals addressed during session: Mobility/safety with mobility;Balance;Proper use of DME OT goals addressed during session: ADL's and self-care;Proper use of Adaptive equipment and DME       AM-PAC PT "6 Clicks" Daily Activity  Outcome Measure Difficulty turning over in bed (including adjusting bedclothes, sheets and blankets)?: Total Difficulty moving from lying on back to sitting on the side of the bed? : Total Difficulty sitting down on and standing up from a chair with arms (e.g., wheelchair, bedside commode, etc,.)?: Total Help needed moving to and from a bed to chair (including a wheelchair)?: A Lot Help needed walking in hospital room?: Total Help needed climbing 3-5 steps with a railing? : Total 6 Click Score: 7    End of Session Equipment Utilized During Treatment: Gait belt Activity Tolerance: Patient limited by pain Patient left: in chair;with call bell/phone within reach;with chair alarm set Nurse Communication: Mobility status;Precautions;Weight bearing status PT Visit Diagnosis: Unsteadiness on feet (R26.81);Other abnormalities of gait and mobility (R26.89);Repeated falls (R29.6);History of  falling (Z91.81);Muscle weakness (generalized) (M62.81);Difficulty in walking, not elsewhere classified (R26.2);Pain Pain - Right/Left: Right Pain - part of body: Hip    Time: 6962-9528 PT Time Calculation (min) (ACUTE ONLY): 28 min   Charges:   PT Evaluation $PT Eval Low Complexity: 1 Procedure     PT G Codes:        Marilla Boddy B. Beverely Risen PT, DPT Acute Rehabilitation  [phone] Pager [phone]    Elon Alas Fleet 07/01/2016, 5:24 PM

## 2016-07-02 LAB — BPAM RBC
Blood Product Expiration Date: 201806012359
ISSUE DATE / TIME: 201805281253
Unit Type and Rh: 600

## 2016-07-02 LAB — TYPE AND SCREEN
ABO/RH(D): A POS
ANTIBODY SCREEN: NEGATIVE
Unit division: 0

## 2016-07-02 LAB — BASIC METABOLIC PANEL
Anion gap: 6 (ref 5–15)
BUN: 14 mg/dL (ref 6–20)
CALCIUM: 8.3 mg/dL — AB (ref 8.9–10.3)
CO2: 27 mmol/L (ref 22–32)
CREATININE: 0.54 mg/dL (ref 0.44–1.00)
Chloride: 104 mmol/L (ref 101–111)
GFR calc non Af Amer: >60 mL/min (ref >60)
Glucose, Bld: 100 mg/dL — ABNORMAL HIGH (ref 65–99)
Potassium: 4.2 mmol/L (ref 3.5–5.1)
SODIUM: 137 mmol/L (ref 135–145)

## 2016-07-02 LAB — CBC
HCT: 27.3 % — ABNORMAL LOW (ref 36.0–46.0)
Hemoglobin: 9.1 g/dL — ABNORMAL LOW (ref 12.0–15.0)
MCH: 29.8 pg (ref 26.0–34.0)
MCHC: 33.3 g/dL (ref 30.0–36.0)
MCV: 89.5 fL (ref 78.0–100.0)
PLATELETS: 142 10*3/uL — AB (ref 150–400)
RBC: 3.05 MIL/uL — ABNORMAL LOW (ref 3.87–5.11)
RDW: 15.5 % (ref 11.5–15.5)
WBC: 7.2 10*3/uL (ref 4.0–10.5)

## 2016-07-02 MED ORDER — ENSURE ENLIVE PO LIQD: 237.0000 mL | Freq: Two times a day (BID) | ORAL | 12 refills | Status: AC

## 2016-07-02 MED ORDER — BISACODYL 5 MG PO TBEC: 5.0000 mg | DELAYED_RELEASE_TABLET | Freq: Every day | ORAL | 0 refills | Status: AC | PRN

## 2016-07-02 MED ORDER — APIXABAN 2.5 MG PO TABS: 2.5000 mg | ORAL_TABLET | Freq: Two times a day (BID) | ORAL | 0 refills | Status: AC
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
## 2016-07-02 NOTE — [place] FL2 (Signed)
[place] MEDICAID FL2 LEVEL OF CARE SCREENING TOOL     IDENTIFICATION  Patient Name: Megan Murillo Birthdate: November 13, 1923 Sex: female Admission Date (Current Location): 06/29/2016  [facility] and [place] Number:  Producer, television/film/video and Address:  [facility] [place], [address] [place], [place] [address]      Provider Number: [id number]  Attending Physician Name and Address:  Elgergawy, Leana Roe, MD  Relative Name and Phone Number:  Reita Cliche, son    Current Level of Care: Hospital Recommended Level of Care: Skilled Nursing Facility Prior Approval Number:    Date Approved/Denied: 07/02/16 PASRR Number: [id number]  Discharge Plan: Home    Current Diagnoses: Patient Active Problem List   Diagnosis Date Noted  . Leukocytosis 06/29/2016  . Hip fracture (HCC) 06/29/2016  . Elevated CK 06/29/2016  . DVT (deep venous thrombosis) (HCC)   . Pulmonary embolism (HCC)   . Pulmonary embolus (HCC) 11/23/2015  . Acute deep vein thrombosis (DVT) of right lower extremity (HCC)   . Postoperative anemia due to acute blood loss 09/24/2015  . Closed fracture of right hip (HCC) 09/21/2015  . Essential hypertension 09/21/2015  . Pre-op evaluation 09/21/2015    Orientation RESPIRATION BLADDER Height & Weight     Self, Situation, Place  Normal Continent Weight: 124 lb (56.2 kg) Height:  5\' 4"  (162.6 cm)  BEHAVIORAL SYMPTOMS/MOOD NEUROLOGICAL BOWEL NUTRITION STATUS      Continent Diet (see DC Summary)  AMBULATORY STATUS COMMUNICATION OF NEEDS Skin   Extensive Assist Verbally Surgical wounds (Closed Hip Incision with Silicone Dressing)                       Personal Care Assistance Level of Assistance  Bathing, Feeding, Dressing Bathing Assistance: Limited assistance Feeding assistance: Limited assistance Dressing Assistance: Limited assistance     Functional Limitations Info  Sight, Hearing, Speech Sight Info: Adequate Hearing Info: Adequate Speech Info:  Adequate    SPECIAL CARE FACTORS FREQUENCY  PT (By licensed PT), OT (By licensed OT)     PT Frequency: 5xweek OT Frequency: 5xweek            Contractures      Additional Factors Info  Code Status, Allergies Code Status Info: Full Allergies Info: FLU VIRUS VACCINE            Current Medications (07/02/2016):  This is the current hospital active medication list Current Facility-Administered Medications  Medication Dose Route Frequency Provider Last Rate Last Dose  . 0.9 %  sodium chloride infusion   Intravenous Continuous Elgergawy, Leana Roe, MD 50 mL/hr at 07/01/16 1242    . acetaminophen (TYLENOL) tablet 650 mg  650 mg Oral Q6H PRN Georga Bora, PA-C   650 mg at 07/01/16 2003   Or  . acetaminophen (TYLENOL) suppository 650 mg  650 mg Rectal Q6H PRN McKenzie, Kayla J, PA-C      . apixaban (ELIQUIS) tablet 2.5 mg  2.5 mg Oral BID Estill Bamberg, MD   2.5 mg at 07/02/16 1002  . bisacodyl (DULCOLAX) EC tablet 5 mg  5 mg Oral Daily PRN McKenzie, Kayla J, PA-C      . docusate sodium (COLACE) capsule 100 mg  100 mg Oral BID Toya Smothers M, NP   100 mg at 07/02/16 1002  . feeding supplement (ENSURE ENLIVE) (ENSURE ENLIVE) liquid 237 mL  237 mL Oral BID BM Elgergawy, Leana Roe, MD   237 mL at 07/02/16 1011  .  Glycerin (Adult) 2.1 g suppository 1 suppository  1 suppository Rectal Daily PRN Haydee SalterHobbs, Phillip M, MD      . HYDROcodone-acetaminophen (NORCO/VICODIN) 5-325 MG per tablet 1-2 tablet  1-2 tablet Oral Q6H PRN McKenzieEilene Ghazi, Kayla J, PA-C   2 tablet at 07/02/16 0421  . menthol-cetylpyridinium (CEPACOL) lozenge 3 mg  1 lozenge Oral PRN McKenzie, Kayla J, PA-C       Or  . phenol (CHLORASEPTIC) mouth spray 1 spray  1 spray Mouth/Throat PRN McKenzie, Kayla J, PA-C      . methocarbamol (ROBAXIN) tablet 500 mg  500 mg Oral Q6H PRN Black, Lesle ChrisKaren M, NP       Or  . methocarbamol (ROBAXIN) 500 mg in dextrose 5 % 50 mL IVPB  500 mg Intravenous Q6H PRN Black, Karen M, NP      . metoCLOPramide  (REGLAN) tablet 5 mg  5 mg Oral Q8H PRN McKenzie, Kayla J, PA-C       Or  . metoCLOPramide (REGLAN) injection 5 mg  5 mg Intravenous Q8H PRN McKenzie, Kayla J, PA-C      . morphine 4 MG/ML injection 0.52 mg  0.52 mg Intravenous Q2H PRN Black, Karen M, NP      . ondansetron (ZOFRAN) tablet 4 mg  4 mg Oral Q6H PRN McKenzie, Kayla J, PA-C       Or  . ondansetron (ZOFRAN) injection 4 mg  4 mg Intravenous Q6H PRN McKenzie, Kayla J, PA-C      . senna (SENOKOT) tablet 8.6 mg  1 tablet Oral Daily Elgergawy, Leana Roeawood S, MD   8.6 mg at 07/02/16 1002  . simvastatin (ZOCOR) tablet 40 mg  40 mg Oral q1800 Gwenyth BenderBlack, Karen M, NP   40 mg at 07/01/16 1705  . sodium phosphate (FLEET) 7-19 GM/118ML enema 1 enema  1 enema Rectal Once PRN McKenzie, Eilene GhaziKayla J, PA-C      . triamterene-hydrochlorothiazide (MAXZIDE-25) 37.5-25 MG per tablet 1 tablet  1 tablet Oral Daily McKenzie, Kayla J, PA-C   1 tablet at 07/02/16 1002  . zolpidem (AMBIEN) tablet 5 mg  5 mg Oral QHS PRN McKenzie, Eilene GhaziKayla J, PA-C   5 mg at 07/01/16 2242     Discharge Medications: Please see discharge summary for a list of discharge medications.  Relevant Imaging Results:  Relevant Lab Results:   Additional Information [id number]  Tresa MoorePatricia V Romeo Zielinski, LCSW

## 2016-07-02 NOTE — Progress Notes (Signed)
Physical Therapy Treatment Patient Details Name: Megan MilchCallie Schupp MRN: [id number] DOB: [birth year] Today's Date: 07/02/2016    History of Present Illness 83 yo female presenting with R hip pain after mechanical fall, s/p R intratrochanteric IM nailing PMH is significant for bilateral PE, HTN, DVT, leukocytosis and vertigo.     PT Comments    Pt perform 5x sit to stands in order to remove stedy pads from underneath pt. Unfortunately, pt experienced increase fatigue with each sit to stand. VCS for increased hip extension for improved posture within the steady were used w/o, but pt was not following commands well due to increased pain as well as fear of falling.  Stedy pads were finally removed and pt returned to bed. Pt was rolled L<>R several times with max A and a bed pan was placed. Pt voided and was cleaned up after. Pt is extremely wary of Stedy now and no longer open to using the [place].   Follow Up Recommendations  SNF;Supervision/Assistance - 24 hour     Equipment Recommendations       Recommendations for Other Services       Precautions / Restrictions Precautions Precautions: Fall Precaution Comments: fell in kitchen when leg gave way Restrictions Weight Bearing Restrictions: Yes RLE Weight Bearing: Weight bearing as tolerated    Mobility  Bed Mobility Overal bed mobility: Needs Assistance Bed Mobility: Supine to Sit     Supine to sit: Max assist;+2 for physical assistance     General bed mobility comments: pt required maxA+2 for trunk to upright and LE off bed  Transfers Overall transfer level: Needs assistance   Transfers: Sit to/from Stand Sit to Stand: Max assist;+2 physical assistance         General transfer comment: max+2 for power up as well as weight shift to adjust within stedy. Pt performed 5 sit to stands in order to remove stedy pads from pt. Experienced increased fatigue with each transfer therefore requiring more assistance   Ambulation/Gait                  Stairs            Wheelchair Mobility    Modified Rankin (Stroke Patients Only)       Balance Overall balance assessment: Needs assistance Sitting-balance support: Bilateral upper extremity supported;Feet supported Sitting balance-Leahy Scale: Poor Sitting balance - Comments: pt with excessive posterior lean and difficulty bending knees to support herself with flat feet Postural control: Posterior lean Standing balance support: Bilateral upper extremity supported                                Cognition Arousal/Alertness: Awake/alert Behavior During Therapy: WFL for tasks assessed/performed Overall Cognitive Status: Within Functional Limits for tasks assessed                                        Exercises      General Comments        Pertinent Vitals/Pain Pain Assessment: 0-10 Pain Score: 8  Pain Location: R hip  Pain Descriptors / Indicators: Grimacing;Guarding;Throbbing Pain Intervention(s): Limited activity within patient's tolerance;Patient requesting pain meds-RN notified;RN gave pain meds during session    Home Living Family/patient expects to be discharged to:: Private residence Living Arrangements: Alone Available Help at Discharge: Family;Available 24 hours/day Type of Home: Apartment Home Access:  Level entry   Home Layout: One level Home Equipment: Walker - 2 wheels;Cane - single point;Grab bars - tub/shower;Grab bars - toilet;Bedside commode      Prior Function Level of Independence: Needs assistance  Gait / Transfers Assistance Needed: ambulating with RW ADL's / Homemaking Assistance Needed: assist with bathing and dressing, housekeeping and meal prep Comments: pt reports falling once since Christmas but noted in chart by family members that she has fallen repeatedly in the last week   PT Goals (current goals can now be found in the care plan section) Acute Rehab PT Goals Patient Stated Goal:  Stop pain PT Goal Formulation: With patient Time For Goal Achievement: 07/15/16 Potential to Achieve Goals: Fair Progress towards PT goals: Progressing toward goals (slowly)    Frequency    Min 3X/week      PT Plan      Co-evaluation              AM-PAC PT "6 Clicks" Daily Activity  Outcome Measure  Difficulty turning over in bed (including adjusting bedclothes, sheets and blankets)?: Total Difficulty moving from lying on back to sitting on the side of the bed? : Total Difficulty sitting down on and standing up from a chair with arms (e.g., wheelchair, bedside commode, etc,.)?: Total Help needed moving to and from a bed to chair (including a wheelchair)?: A Lot Help needed walking in hospital room?: Total Help needed climbing 3-5 steps with a railing? : Total 6 Click Score: 7    End of Session   Activity Tolerance: Patient limited by fatigue;Patient limited by pain Patient left: in bed;with call bell/phone within reach;with bed alarm set Nurse Communication: Mobility status PT Visit Diagnosis: Unsteadiness on feet (R26.81);Other abnormalities of gait and mobility (R26.89);Repeated falls (R29.6);History of falling (Z91.81);Muscle weakness (generalized) (M62.81);Difficulty in walking, not elsewhere classified (R26.2);Pain Pain - Right/Left: Right Pain - part of body: Hip     Time: 1610-9604 PT Time Calculation (min) (ACUTE ONLY): 38 min  Charges:  $Therapeutic Activity: 38-52 mins                    G Codes:       Olin Hauser, SPT [phone]   Eileen Stanford Benson Porcaro 07/02/2016, 5:33 PM

## 2016-07-02 NOTE — Op Note (Signed)
NAMECATHERIN, Murillo               ACCOUNT NO.:  [id number]  MEDICAL RECORD NO.:  [id number]  PHYSICIAN:  Megan Bamberg, MD           DATE OF BIRTH:  DATE OF PROCEDURE:  06/30/2016                              OPERATIVE REPORT   PREOPERATIVE DIAGNOSIS:  Intertrochanteric right hip fracture with a subtrochanteric extension, displaced, comminuted.  POSTOPERATIVE DIAGNOSIS:  Intertrochanteric right hip fracture with a subtrochanteric extension, displaced, comminuted.  PROCEDURE:  Closed reduction of right hip fracture with intramedullary fixation using a cephalomedullary nail (Affixus Biomet nail).  SURGEON:  Megan Bamberg, MD  ASSISTANT:  Megan Coop, PA-C.  ANESTHESIA:  General endotracheal anesthesia.  COMPLICATIONS:  None.  DISPOSITION:  Stable.  ESTIMATED BLOOD LOSS:  Minimal.  INDICATIONS FOR SURGERY:  Briefly, [name] is a pleasant 83 year old female who is almost 1 year status post a left intertrochanteric fracture.  She recently sustained a fall and did have a deformity and pain in her right hip.  X-rays did reveal a comminuted right intertrochanteric fracture with a subtrochanteric extension.  Given the patient's inability to bear weight, we did discuss proceeding with the procedure reflected above.  Of note, I did discuss the procedure with her sister and her son.  I did specifically discuss the risks of surgery, including malunion, nonunion, in addition to infection and neurovascular injury.  Both the patient and her family did understand the nature of the procedure and did elect to proceed.  OPERATIVE DETAILS:  On Jun 30, 2016, the patient was brought to Surgery and general endotracheal anesthesia was administered.  The patient was placed supine on a fracture table.  The patient's left lower extremity was lower toward the ground and held into position using padding and Coban.  The right foot was secured into a padded foot holder. Distraction was applied  and the appropriate degree of rotation was dialed in, liberally using AP and lateral fluoroscopy.  I did use the medial cortices to help guide the appropriate degree of rotation.  The medial calcar was appropriately aligned.  There was comminution noted laterally, in addition to a subtrochanteric extension to the fracture. Once the patient was appropriately positioned, the right hip and leg were prepped and draped in the usual sterile fashion.  I then made a 3- cm incision just proximal to the greater trochanter.  A guidewire was advanced through the femoral shaft.  The starting point of the guidewire was at the tip of the greater trochanter.  I did liberally use AP and lateral fluoroscopy to ensure the appropriate positioning of the guidewire.  I then used the entry reamer and I then used reamers up to a 13-mm reamer.  Good chatter was noted.  An 11 x 360-mm implant was then advanced over the guidewire.  I then drilled through the lag screw hole, after making a small incision.  Excellent positioning of the guidewire was noted on both AP and lateral fluoroscopic images.  I then placed a 10.5 x 100-mm lag screw through the neck of the femur.  Good apposition was noted medially.  The lag screw was then locked to the nail in the usual fashion.  I was very pleased with the final AP and lateral fluoroscopic images.  I then brought in fluoroscopy at the distal aspect  of the nail.  I then advanced a 5 x 38-mm screw from lateral to medial, through the locking hole distally in the nail.  Excellent purchase of the screw was noted.  The head was then removed from the nail and all wounds were copiously irrigated.  The wounds were then closed using #1 Vicryl followed by 2-0 Vicryl, followed by staples.  Bacitracin followed by sterile dressing was then applied.  The patient was then awakened from general endotracheal anesthesia and transferred to recovery in stable condition.  Of note, Megan Murillo  was my assistant throughout surgery, and did aid in retraction, suctioning and closure from start to finish.     Megan BambergMark Jeffre Enriques, MD   ______________________________ Megan BambergMark Moxon Messler, MD    MD/MEDQ  D:  06/30/2016  T:  07/01/2016  Job:  [id number]

## 2016-07-02 NOTE — Progress Notes (Signed)
Spoke with Social Worker, Doctor, general practicearah; informed patient will not likely go to SNF until tomorrow.  Patient had pre-existing balance that family went to facility to discuss.

## 2016-07-02 NOTE — Discharge Instructions (Signed)
Follow with Primary MD [name], [name], [name] after discharge from SNF  Get CBC, CMP,  checked  by Primary MD next visit.    Activity: As tolerated with Full fall precautions use walker/cane & assistance as needed   Disposition SNF   Diet: Heart Healthy  , with feeding assistance and aspiration precautions.  For Heart failure patients - Check your Weight same time everyday, if you gain over 2 pounds, or you develop in leg swelling, experience more shortness of breath or chest pain, call your Primary MD immediately. Follow Cardiac Low Salt Diet and 1.5 lit/day fluid restriction.   On your next visit with your primary care physician please Get Medicines reviewed and adjusted.   Please request your Prim.MD to go over all Hospital Tests and Procedure/Radiological results at the follow up, please get all Hospital records sent to your Prim MD by signing hospital release before you go home.   If you experience worsening of your admission symptoms, develop shortness of breath, life threatening emergency, suicidal or homicidal thoughts you must seek medical attention immediately by calling 911 or calling your MD immediately  if symptoms less severe.  You Must read complete instructions/literature along with all the possible adverse reactions/side effects for all the Medicines you take and that have been prescribed to you. Take any new Medicines after you have completely understood and accpet all the possible adverse reactions/side effects.   Do not drive, operating heavy machinery, perform activities at heights, swimming or participation in water activities or provide baby sitting services if your were admitted for syncope or siezures until you have seen by Primary MD or a Neurologist and advised to do so again.  Do not drive when taking Pain medications.    Do not take more than prescribed Pain, Sleep and Anxiety Medications  Special Instructions: If you have smoked or chewed Tobacco  in the  last 2 yrs please stop smoking, stop any regular Alcohol  and or any Recreational drug use.  Wear Seat belts while driving.   Please note  You were cared for by a hospitalist during your hospital stay. If you have any questions about your discharge medications or the care you received while you were in the hospital after you are discharged, you can call the unit and asked to speak with the hospitalist on call if the hospitalist that took care of you is not available. Once you are discharged, your primary care physician will handle any further medical issues. Please note that NO REFILLS for any discharge medications will be authorized once you are discharged, as it is imperative that you return to your primary care physician (or establish a relationship with a primary care physician if you do not have one) for your aftercare needs so that they can reassess your need for medications and monitor your lab values.

## 2016-07-02 NOTE — Social Work (Signed)
[facility] Awaiting [facility]. Patient selects bed at [facility]. Facility aware and has offered bed however, patient has a balance.  CSW advised patient family and they are following up.  There are other bed offers if warranted.

## 2016-07-03 DIAGNOSIS — I82401 Acute embolism and thrombosis of unspecified deep veins of right lower extremity: Secondary | ICD-10-CM

## 2016-07-03 DIAGNOSIS — D72829 Elevated white blood cell count, unspecified: Secondary | ICD-10-CM

## 2016-07-03 LAB — CBC
HEMATOCRIT: 28 % — AB (ref 36.0–46.0)
HEMOGLOBIN: 9.1 g/dL — AB (ref 12.0–15.0)
MCH: 29.2 pg (ref 26.0–34.0)
MCHC: 32.5 g/dL (ref 30.0–36.0)
MCV: 89.7 fL (ref 78.0–100.0)
Platelets: 150 10*3/uL (ref 150–400)
RBC: 3.12 MIL/uL — ABNORMAL LOW (ref 3.87–5.11)
RDW: 15 % (ref 11.5–15.5)
WBC: 7.5 10*3/uL (ref 4.0–10.5)

## 2016-07-03 LAB — BASIC METABOLIC PANEL
Anion gap: 4 — ABNORMAL LOW (ref 5–15)
BUN: 14 mg/dL (ref 6–20)
CHLORIDE: 101 mmol/L (ref 101–111)
CO2: 28 mmol/L (ref 22–32)
CREATININE: 0.6 mg/dL (ref 0.44–1.00)
Calcium: 8.5 mg/dL — ABNORMAL LOW (ref 8.9–10.3)
GFR calc non Af Amer: >60 mL/min (ref >60)
GLUCOSE: 110 mg/dL — AB (ref 65–99)
Potassium: 3.7 mmol/L (ref 3.5–5.1)
Sodium: 133 mmol/L — ABNORMAL LOW (ref 135–145)

## 2016-07-03 LAB — CK: CK TOTAL: 246 U/L — AB (ref 38–234)

## 2016-07-03 LAB — MAGNESIUM: Magnesium: 1.7 mg/dL (ref 1.7–2.4)

## 2016-07-03 MED ORDER — MAGNESIUM SULFATE 2 GM/50ML IV SOLN
2.0000 g | Freq: Once | INTRAVENOUS | Status: DC
  Filled 2016-07-03: qty 50

## 2016-07-03 NOTE — Progress Notes (Signed)
Physical Therapy Treatment Patient Details Name: Megan Murillo MRN: [id number] DOB: [birth year] Today's Date: 07/03/2016    History of Present Illness 83 yo female presenting with R hip pain after mechanical fall, s/p R intratrochanteric IM nailing PMH is significant for bilateral PE, HTN, DVT, leukocytosis and vertigo.     PT Comments    Pt performed transfer to chair and therapeutic exercise during session.  Pt remains to fatigue quickly and requires significant external assistance during session.  Plan to d/c to SNF today.   Follow Up Recommendations  SNF;Supervision/Assistance - 24 hour     Equipment Recommendations   (TBD at next venue)    Recommendations for Other Services       Precautions / Restrictions Precautions Precautions: Fall Precaution Comments: fell in kitchen when leg gave way Restrictions Weight Bearing Restrictions: Yes RLE Weight Bearing: Weight bearing as tolerated    Mobility  Bed Mobility Overal bed mobility: Needs Assistance Bed Mobility: Supine to Sit     Supine to sit: Max assist;+2 for physical assistance     General bed mobility comments: pt required maxA+2 for trunk to upright and LE off bed  Transfers Overall transfer level: Needs assistance Equipment used: None Transfers: Sit to/from [employer] Sit to Stand: Max assist;+2 physical assistance Stand pivot transfers: Max assist;+2 physical assistance       General transfer comment: Pt held to PTA and NT arms for support while assist provided to boost into standing.  able to move with facilitory movement for transfer to chair.    Ambulation/Gait Ambulation/Gait assistance:  (Remains unable.  )               Stairs            Wheelchair Mobility    Modified Rankin (Stroke Patients Only)       Balance Overall balance assessment: Needs assistance   Sitting balance-Leahy Scale: Poor       Standing balance-Leahy Scale: Zero                               Cognition Arousal/Alertness: Awake/alert Behavior During Therapy: WFL for tasks assessed/performed Overall Cognitive Status: Within Functional Limits for tasks assessed                                        Exercises Total Joint Exercises Ankle Circles/Pumps: AROM;Both;20 reps;Supine Quad Sets: AROM;Both;10 reps;Supine Hip ABduction/ADduction: AROM;AAROM;Both;10 reps;Supine Straight Leg Raises: AROM;AAROM;Both;10 reps;Supine Long Arc Quad: AROM;Both;10 reps;Supine    General Comments        Pertinent Vitals/Pain Pain Assessment: 0-10 Pain Score: 5  Pain Location: R hip  Pain Descriptors / Indicators: Grimacing;Guarding;Throbbing Pain Intervention(s): Monitored during session;Repositioned;Ice applied    Home Living                      Prior Function            PT Goals (current goals can now be found in the care plan section) Acute Rehab PT Goals Patient Stated Goal: Stop pain Potential to Achieve Goals: Fair Progress towards PT goals: Progressing toward goals    Frequency    Min 3X/week      PT Plan Current plan remains appropriate    Co-evaluation  AM-PAC PT "6 Clicks" Daily Activity  Outcome Measure  Difficulty turning over in bed (including adjusting bedclothes, sheets and blankets)?: Total Difficulty moving from lying on back to sitting on the side of the bed? : Total Difficulty sitting down on and standing up from a chair with arms (e.g., wheelchair, bedside commode, etc,.)?: Total Help needed moving to and from a bed to chair (including a wheelchair)?: A Lot Help needed walking in hospital room?: Total Help needed climbing 3-5 steps with a railing? : Total 6 Click Score: 7    End of Session Equipment Utilized During Treatment: Gait belt Activity Tolerance: Patient limited by fatigue;Patient limited by pain Patient left: in bed;with call bell/phone within reach;with bed alarm  set Nurse Communication: Mobility status PT Visit Diagnosis: Unsteadiness on feet (R26.81);Other abnormalities of gait and mobility (R26.89);Repeated falls (R29.6);History of falling (Z91.81);Muscle weakness (generalized) (M62.81);Difficulty in walking, not elsewhere classified (R26.2);Pain Pain - Right/Left: Right Pain - part of body: Hip     Time: 1610-96041020-1038 PT Time Calculation (min) (ACUTE ONLY): 18 min  Charges:  $Therapeutic Activity: 8-22 mins                    G Codes:       Joycelyn RuaAimee Ori Kreiter, PTA pager [phone]    Florestine AversAimee J Ulani Degrasse 07/03/2016, 2:06 PM

## 2016-07-03 NOTE — Progress Notes (Signed)
PROGRESS NOTE    Megan Murillo  ZOX:[id number] DOB: 12/14/1923 DOA: 06/29/2016 PCP: [name], [name], [name]    Brief Narrative:  Megan Murillo A [facility] McHoneis a 83 y.o.femalewith a Past Medical History significant for hx of PE, [name].    Assessment & Plan:   Principal Problem:   Closed fracture of right hip [facility](HCC) Active Problems:   Essential hypertension   Acute deep vein thrombosis (DVT) of right lower extremity (HCC)   Leukocytosis   Pulmonary embolism (HCC)   Elevated CK  Right hip fracture - Secondary to mechanical fall, patient was seen in consultation by orthopedics and patient's subsequently underwent IM fixation of right hip 5/25 by Dr. Yevette Edwardsumonski. - on Eliquis for DVT prophylaxis. Continue with when necessary pain medication. PT/OT. Outpatient follow-up with orthopedics.  Leukocytosis - Most likely stress related from fracture, negative chest x-ray, as well no cardiopulmonary disease and chest x-ray. Leukocytosis resolved.  Hypertension - Stable. Hydralazine as needed.   Rhabdomyolysis - Trending down, renal function at baseline, this is most likely related to fracture and muscle injury. Total CK levels at 246 today. Saline lock IV fluids.  Frequent falls - PT consulted. Patient will be discharged to a skilled nursing facility.  Acute blood loss anemia - Postoperative, hemoglobin is 7.8. Patient was transfused 2 units packed red blood cells and hemoglobin stabilized at 9.1 by day of discharge.   Hypokalemia - Repleted by day of discharge.   History of DVT/Benadryl PE in October 2017 - This was 2 month after her left hip surgery - Related issue was total of 6 months, appears to be provoked DVT - Currently on Eliquis  2.5 twice a day for DVT prophylaxis postoperatively.     DVT prophylaxis: SCDs, Eliquis Code Status: Full Family Communication: Updated patient. No family at  bedside. Disposition Plan: Skilled nursing facility   Consultants:   Orthopedics: Dr. Conley Canalemasi 06/29/2016  Procedures:   S/p IM fixation of right hip fracture 5/27 Transfuse 2 units packed red blood cells.  Antimicrobials:   None   Subjective: Patient states pain is controlled. No chest pain. No shortness of breath.  Objective: Vitals:   07/02/16 1001 07/02/16 1454 07/02/16 2048 07/03/16 0452  BP: 129/60 138/60 106/76 (!) 138/59  Pulse: 81 80 70 76  Resp: 18 18 18 18   Temp:  98.2 F (36.8 C) 98.8 F (37.1 C) 98.2 F (36.8 C)  TempSrc:  Oral Oral Oral  SpO2:  98% 97%   Weight:      Height:        Intake/Output Summary (Last 24 hours) at 07/03/16 1234 Last data filed at 07/03/16 1000  Gross per 24 hour  Intake              240 ml  Output                0 ml  Net              240 ml   Filed Weights   06/29/16 1002  Weight: 56.2 kg (124 lb)    Examination:  General exam: Appears calm and comfortable  Respiratory system: Clear to auscultation. Respiratory effort normal. Cardiovascular system: S1 & S2 heard, RRR. No JVD, murmurs, rubs, gallops or clicks. No pedal edema. Gastrointestinal system: Abdomen is nondistended, soft and nontender. No organomegaly or masses felt. Normal bowel sounds heard. Central nervous system: Alert and oriented. No focal neurological deficits.  Extremities: Symmetric 5 x 5 power. Skin: No rashes, lesions or ulcers Psychiatry: Judgement and insight appear normal. Mood & affect appropriate.     Data Reviewed: I have personally reviewed following labs and imaging studies  CBC:  Recent Labs Lab 06/29/16 1033 06/30/16 0334 07/01/16 0544 07/02/16 0520 07/03/16 0619  WBC 14.7* 10.9* 8.8 7.2 7.5  NEUTROABS 12.9*  --   --   --   --   HGB 12.3 9.3* 7.8* 9.1* 9.1*  HCT 36.8 28.9* 23.9* 27.3* 28.0*  MCV 89.1 90.9 90.9 89.5 89.7  PLT 203 152 131* 142* 150   Basic Metabolic Panel:  Recent Labs Lab 06/29/16 1033 06/30/16 0334  07/01/16 0544 07/02/16 0520 07/03/16 0853  NA 136 140 140 137 133*  K 4.1 2.9* 2.9* 4.2 3.7  CL 101 108 106 104 101  CO2 24 26 28 27 28   GLUCOSE 126* 95 105* 100* 110*  BUN 39* 31* 29* 14 14  CREATININE 1.12* 0.73 0.65 0.54 0.60  CALCIUM 9.8 8.4* 8.0* 8.3* 8.5*  MG  --   --   --   --  1.7   GFR: Estimated Creatinine Clearance: 39.6 mL/min (by C-G formula based on SCr of 0.6 mg/dL). Liver Function Tests: No results for input(s): AST, ALT, ALKPHOS, BILITOT, PROT, ALBUMIN in the last 168 hours. No results for input(s): LIPASE, AMYLASE in the last 168 hours. No results for input(s): AMMONIA in the last 168 hours. Coagulation Profile:  Recent Labs Lab 06/29/16 1033 06/30/16 0334  INR 1.08 1.15   Cardiac Enzymes:  Recent Labs Lab 06/29/16 1033 06/30/16 0334 07/01/16 0544 07/03/16 0853  CKTOTAL 9,443* 2,900* 1,062* 246*   BNP (last 3 results) No results for input(s): PROBNP in the last 8760 hours. HbA1C: No results for input(s): HGBA1C in the last 72 hours. CBG:  Recent Labs Lab 07/01/16 1741  GLUCAP 122*   Lipid Profile: No results for input(s): CHOL, HDL, LDLCALC, TRIG, CHOLHDL, LDLDIRECT in the last 72 hours. Thyroid Function Tests: No results for input(s): TSH, T4TOTAL, FREET4, T3FREE, THYROIDAB in the last 72 hours. Anemia Panel: No results for input(s): VITAMINB12, FOLATE, FERRITIN, TIBC, IRON, RETICCTPCT in the last 72 hours. Sepsis Labs:  Recent Labs Lab 06/29/16 1155  LATICACIDVEN 1.45    Recent Results (from the past 240 hour(s))  Surgical pcr screen     Status: None   Collection Time: 06/29/16  2:59 PM  Result Value Ref Range Status   MRSA, PCR NEGATIVE NEGATIVE Final   Staphylococcus aureus NEGATIVE NEGATIVE Final    Comment:        The Xpert SA Assay (FDA approved for NASAL specimens in patients over 77 years of age), is one component of a comprehensive surveillance program.  Test performance has been validated by [facility] for  patients greater than or equal to 31 year old. It is not intended to diagnose infection nor to guide or monitor treatment.   Urine culture     Status: None   Collection Time: 06/29/16  7:12 PM  Result Value Ref Range Status   Specimen Description URINE, RANDOM  Final   Special Requests NONE  Final   Culture NO GROWTH  Final   Report Status 06/30/2016 FINAL  Final         Radiology Studies: No results found.      Scheduled Meds: . apixaban  2.5 mg Oral BID  . docusate sodium  100 mg Oral BID  . feeding supplement (ENSURE ENLIVE)  237 mL Oral BID BM  . senna  1 tablet Oral Daily  . simvastatin  40 mg Oral q1800  . triamterene-hydrochlorothiazide  1 tablet Oral Daily   Continuous Infusions: . sodium chloride 50 mL/hr at 07/01/16 1242  . magnesium sulfate 1 - 4 g bolus IVPB    . methocarbamol (ROBAXIN)  IV       LOS: 4 days    Time spent: 35 mins    Sanyla Summey, MD [facility] Pager [phone]  If 7PM-7AM, please contact night-coverage [email or web address] Password [facility] 07/03/2016, 12:34 PM

## 2016-07-03 NOTE — Progress Notes (Signed)
Prescriptions given to EMS to present to Facility; reminded patient of follow up appointments after SNF discharge and blood work that needs to be done. Verbalized understanding.. Patients son took all belongings home with him

## 2016-07-03 NOTE — OR Nursing (Signed)
Late entry for implant documentation. 

## 2016-07-03 NOTE — Clinical Social Work Placement (Signed)
   CLINICAL SOCIAL WORK PLACEMENT  NOTE  Date:  07/03/2016  Patient Details  Name: Megan Murillo MRN: [id number] Date of Birth: 02-25-1923  Clinical Social Work is seeking post-discharge placement for this patient at the Skilled  Nursing Facility level of care (*CSW will initial, date and re-position this form in  chart as items are completed):  Yes   Patient/family provided with [facility] Clinical Social Work Department's list of facilities offering this level of care within the geographic area requested by the patient (or if unable, by the patient's family).  Yes   Patient/family informed of their freedom to choose among providers that offer the needed level of care, that participate in Medicare, Medicaid or managed care program needed by the patient, have an available bed and are willing to accept the patient.  Yes   Patient/family informed of [facility] ownership interest in [facility] and [facility], as well as of the fact that they are under no obligation to receive care at these facilities.  PASRR submitted to EDS on       PASRR number received on 07/02/16     Existing PASRR number confirmed on 07/02/16     FL2 transmitted to all facilities in geographic area requested by pt/family on 07/02/16     FL2 transmitted to all facilities within larger geographic area on 07/02/16     Patient informed that his/her managed care company has contracts with or will negotiate with certain facilities, including the following:        Yes   Patient/family informed of bed offers received.  [facility]     Physician recommends and patient chooses bed at      [facility].  Patient to be transferred to facility by [facility]     Patient family notified on 07/03/16 of transfer.  Name of family member notified:  bobby     PHYSICIAN Please prepare priority discharge summary, including  medications, Please prepare prescriptions, Please sign FL2     Additional Comment:    _______________________________________________ Tresa MoorePatricia V Zahki Hoogendoorn, LCSW 07/03/2016, 11:22 AM

## 2016-07-03 NOTE — Social Work (Signed)
CSW received insurance [phone]#[id number] from [place].   CSW advised facility of same and family.

## 2016-07-03 NOTE — Progress Notes (Signed)
Discharging patient to [facility]. Leaving 5 north room 17 via Insurance risk surveyorstretcher withEMS personnel. Report given to EMS and [facility]. Last pain medication given at 10:23, denies pain at this time. Notified Dr. Ramiro Harvestaniel Thompson of patient being discharged at 12:30 pm to SNF, discontinued ordered for Magnesium IVPB.

## 2016-07-03 NOTE — Social Work (Addendum)
Clinical Social Worker facilitated patient discharge including contacting patient family and facility to confirm patient discharge plans.  Clinical information faxed to facility and family agreeable with plan.  CSW arranged ambulance transport via [facility] to [facility] .  RN to call [phone] report prior to discharge. Pt going to room 217.  Clinical Social Worker will sign off for now as social work intervention is no longer needed. Please consult us again if new need arises.  Keene BreathPatricia Lavaun Greenfield, LCSW Clinical Social Worker [phone]

## 2016-07-03 NOTE — Plan of Care (Signed)
Problem: Safety: Goal: Ability to remain free from injury will improve Outcome: Progressing No incidence of falls during this admission. Call bell within reach. Bed in low and locked position. Patient alert and oriented. Bed alarm in use. Nonskid footwear being utilized. 3/4 siderails in place. Clean and clear environment maintained. Patient verbalized understanding of safety instruction.  Problem: Pain Managment: Goal: General experience of comfort will improve Outcome: Progressing Pain being managed with PO PRN pain medication. Vital signs are stable. No moaning or facial grimacing evident.

## 2016-07-03 NOTE — Care Management Important Message (Signed)
Important Message  Patient Details  Name: Megan Murillo MRN: [id number] Date of Birth: 10-17-1923   Medicare Important Message Given:  Yes    Emberley Kral 07/03/2016, 2:46 PM

## 2016-07-03 NOTE — Clinical Social Work Note (Signed)
Clinical Social Work Assessment  Patient Details  Name: Elisama Thissen MRN: [id number] Date of Birth: 10-20-1923  Date of referral:  07/02/16               Reason for consult:  Facility Placement                Permission sought to share information with:  Chartered certified accountant granted to share information::  Yes, Verbal Permission Granted  Name::     [employer], water::  SNF  Relationship::  son and sister  Contact Information:     Housing/Transportation Living arrangements for the past 2 months:  Apartment Source of Information:  Patient, Adult Children, Other (Comment Required) (sister) Patient Interpreter Needed:  None Criminal Activity/Legal Involvement Pertinent to Current Situation/Hospitalization:  No - Comment as needed Significant Relationships:  Adult Children, Other Family Members Lives with:  Self Do you feel safe going back to the place where you live?  No Need for family participation in patient care:  Yes (Comment)  Care giving concerns:  Patient resides alone. Patient was independent prior to hospitalization. Patient not safe to return home at this time.  Social Worker assessment / plan:  CSW met with patient, son and sister at bedside. CSW explained her role and SNF options/placement.  Patient/Family has experience with SNF and would like to return to [facility]. Permission received to send out offers to facility and surrounding. CSW discussed insurance auth process and advised that auth needed prior to DC to facility. CSW will fax out clinicals today.  FL2 and passr completed. Offers sent.  Employment status:  Retired Nurse, adult PT Recommendations:  [place] / Referral to community resources:  [place]  Patient/Family's Response to care:  Psychologist, prison and probation services of CSW assistance with SNF placement.  No issues reported about care received.  Patient/Family's  Understanding of and Emotional Response to Diagnosis, Current Treatment, and Prognosis:  Patient/family has good understanding of diagnosis, current treatment and prognosis. They are hopeful rehabilitation will address physical impairment. No issues or concerns at this time.  Emotional Assessment Appearance:  Appears stated age Attitude/Demeanor/Rapport:   (Cooperative) Affect (typically observed):  Accepting, Appropriate Orientation:  Oriented to Self, Oriented to Place, Oriented to  Time, Oriented to Situation Alcohol / Substance use:    Psych involvement (Current and /or in the community):     Discharge Needs  Concerns to be addressed:  Care Coordination Readmission within the last 30 days:  No Current discharge risk:  Dependent with Mobility, Physical Impairment Barriers to Discharge:  No Barriers Identified   Normajean Baxter, LCSW 07/03/2016, 11:19 AM

## 2016-07-04 ENCOUNTER — Encounter (HOSPITAL_COMMUNITY): Payer: Self-pay | Admitting: Orthopedic Surgery

## 2016-11-08 ENCOUNTER — Ambulatory Visit (HOSPITAL_COMMUNITY)
Admission: RE | Admit: 2016-11-08 | Discharge: 2016-11-08 | Disposition: A | Discharge status: Home / Self Care | Payer: Medicare Other | Source: Ambulatory Visit | Attending: Vascular Surgery | Admitting: Vascular Surgery

## 2016-11-08 ENCOUNTER — Other Ambulatory Visit (HOSPITAL_COMMUNITY): Payer: Self-pay | Admitting: Orthopedic Surgery

## 2016-11-08 DIAGNOSIS — M7989 Other specified soft tissue disorders: Secondary | ICD-10-CM | POA: Diagnosis present

## 2016-11-08 DIAGNOSIS — Z86718 Personal history of other venous thrombosis and embolism: Secondary | ICD-10-CM | POA: Diagnosis not present

## 2016-11-08 DIAGNOSIS — Z09 Encounter for follow-up examination after completed treatment for conditions other than malignant neoplasm: Secondary | ICD-10-CM | POA: Insufficient documentation

## 2016-11-08 DIAGNOSIS — I82403 Acute embolism and thrombosis of unspecified deep veins of lower extremity, bilateral: Secondary | ICD-10-CM | POA: Diagnosis present

## 2017-07-04 ENCOUNTER — Ambulatory Visit: Payer: Self-pay | Admitting: Internal Medicine

## 2019-05-23 IMAGING — CT CT CERVICAL SPINE W/O CM
5 of 8 series · 12 of 33 positions shown, 13 images · non-contrast
Comparison: None.

CLINICAL DATA: Fall yesterday, confusion

EXAM:
CT HEAD WITHOUT CONTRAST
CT CERVICAL SPINE WITHOUT CONTRAST
TECHNIQUE: Multidetector CT imaging of the head and cervical spine was
performed following the standard protocol without intravenous
contrast. Multiplanar CT image reconstructions of the cervical spine
were also generated.

[Series 5: head bone · axial · 0.46mm/px · z∈[-35,+23]mm · 2 of 88 slices shown]
[im 30/88  bone]
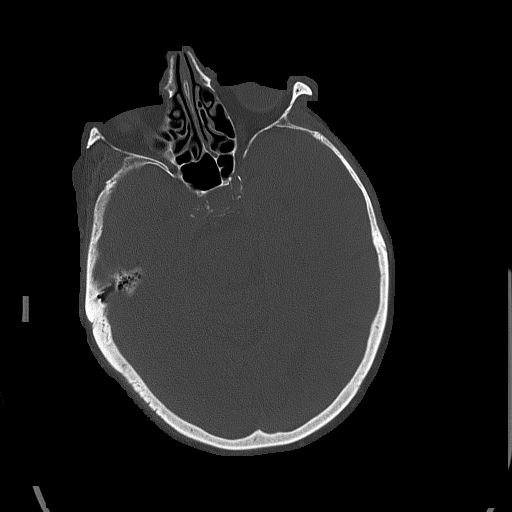
[im 59/88  bone]
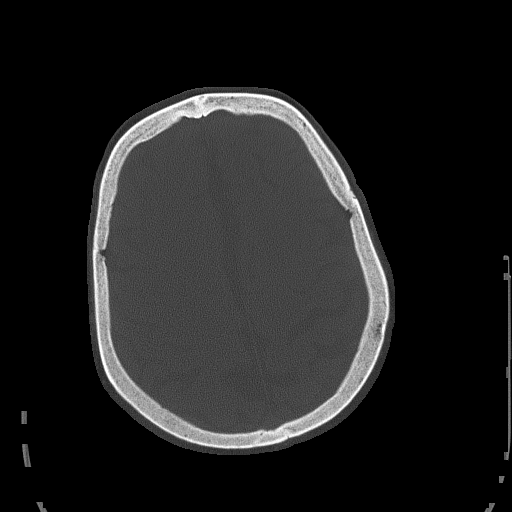

[Series 8: c_spine 2.0 st · axial · 0.41mm/px · z∈[-163,-107]mm · 2 of 86 slices shown, 3 images]
[im 29/86  soft-tissue]
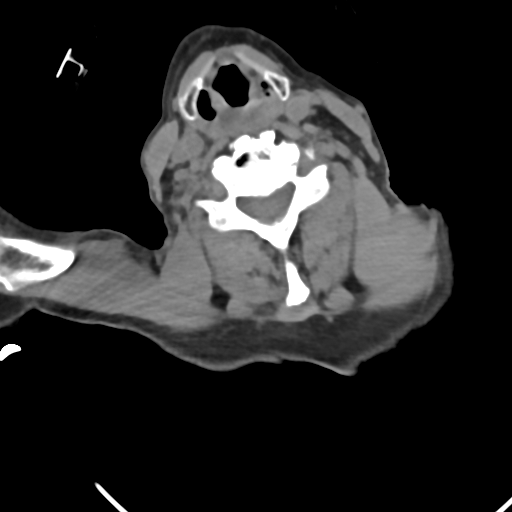
[im 29/86  bone]
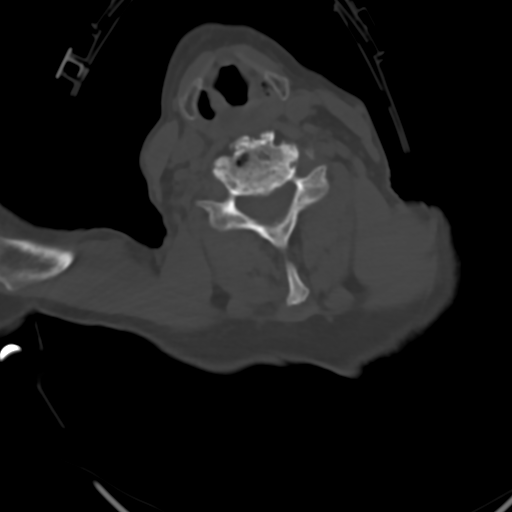
[im 57/86  bone]
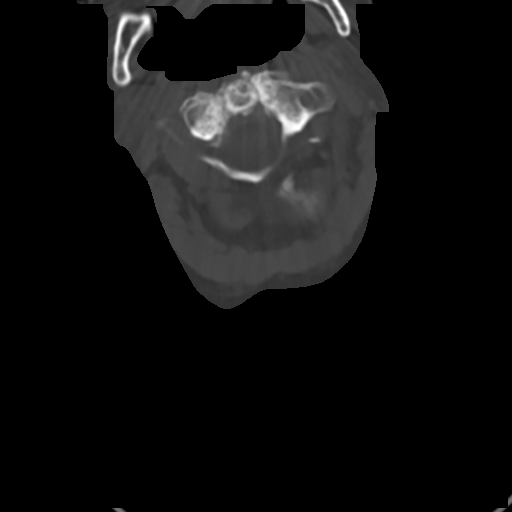

[Series 10: c_spine 2.0 sag bone · sagittal · 0.35mm/px · 4 of 61 slices shown]
[im 13/61  bone]
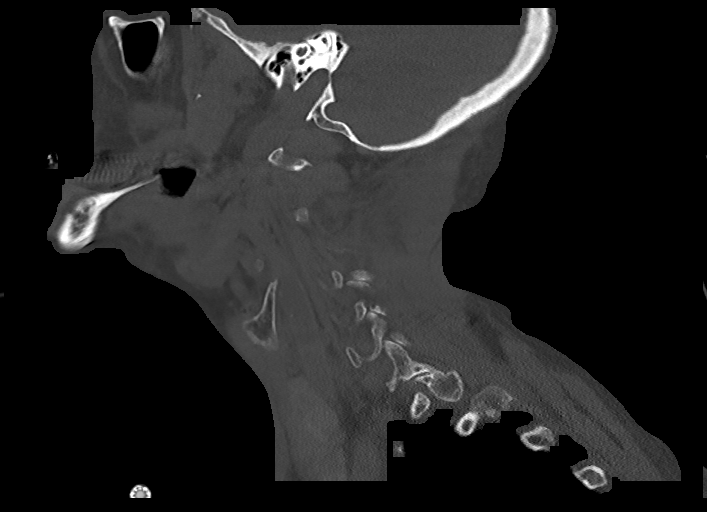
[im 25/61  bone]
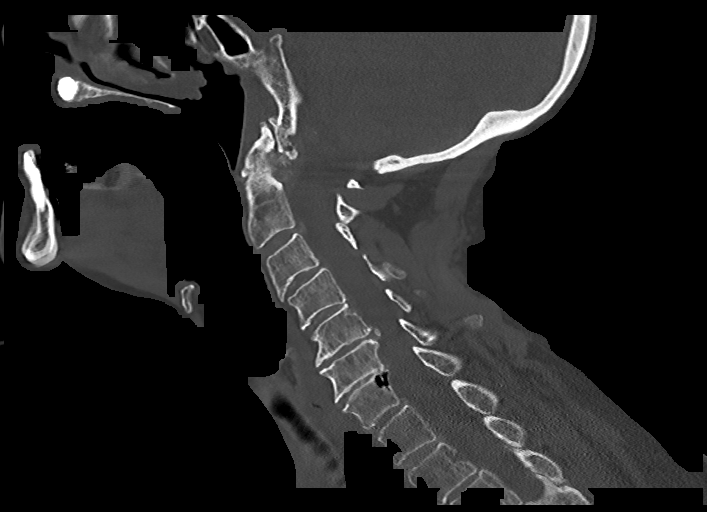
[im 37/61  bone]
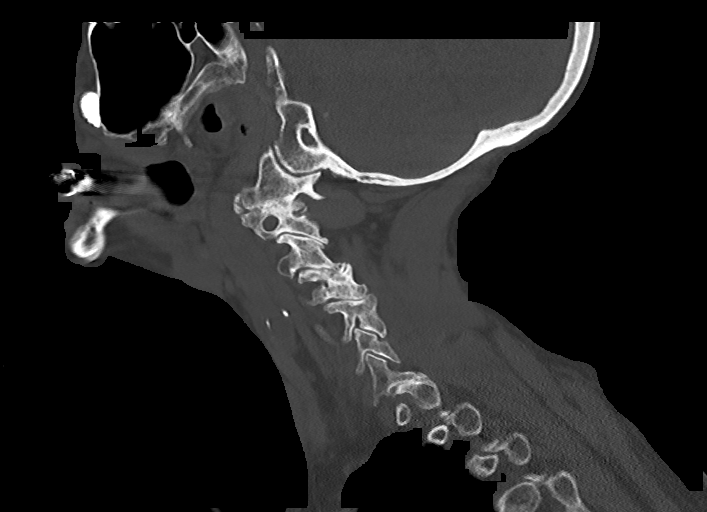
[im 49/61  bone]
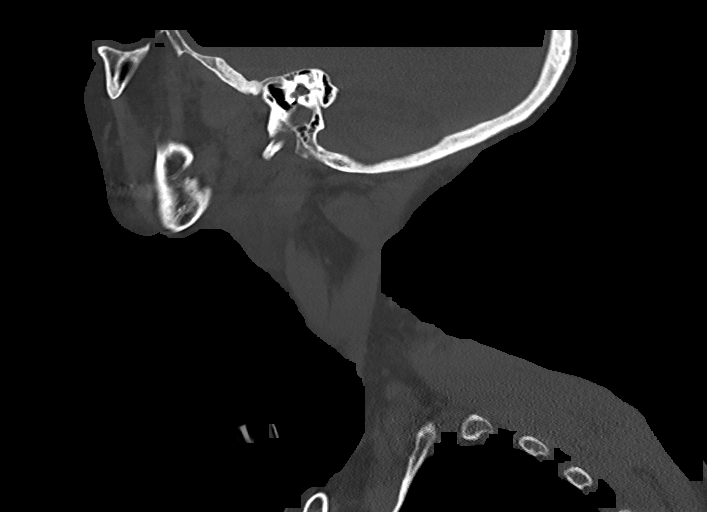

[Series 11: c_spine 2.0 cor bone · coronal · 0.32mm/px · 2 of 109 slices shown]
[im 37/109  bone]
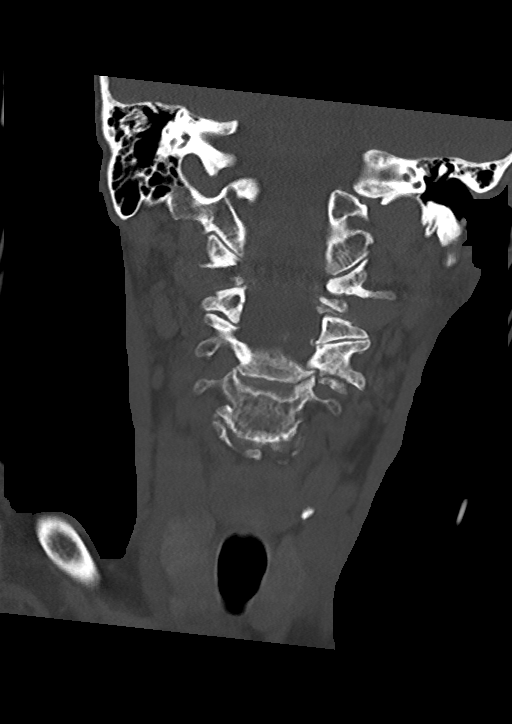
[im 73/109  bone]
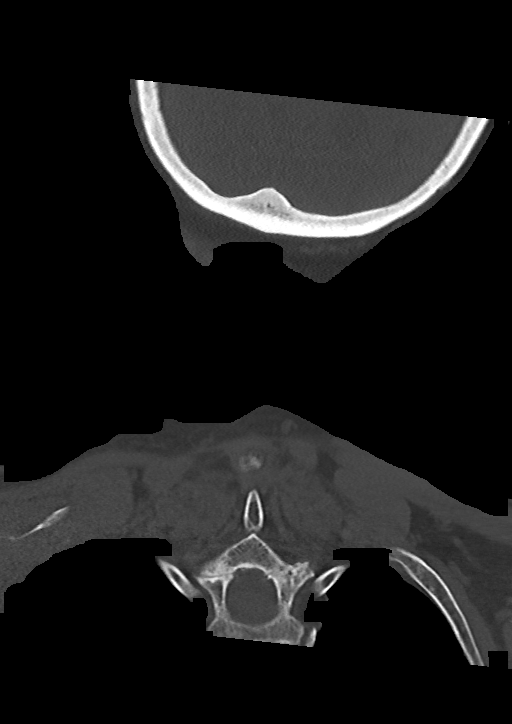

[Series 12: c_spine 2.0 orthogonals · axial · 0.29mm/px · z∈[-199,-141]mm · 2 of 76 slices shown]
[im 26/76  bone]
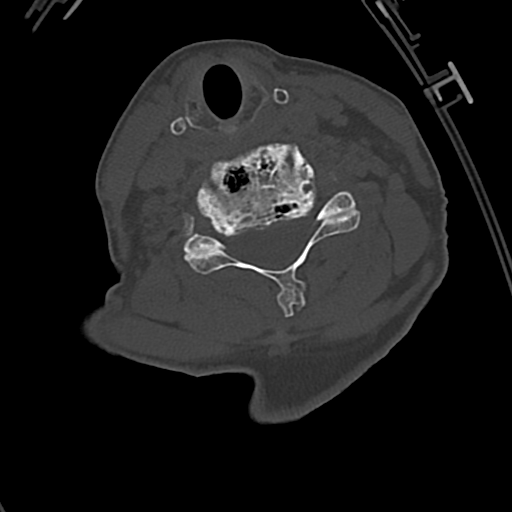
[im 51/76  bone]
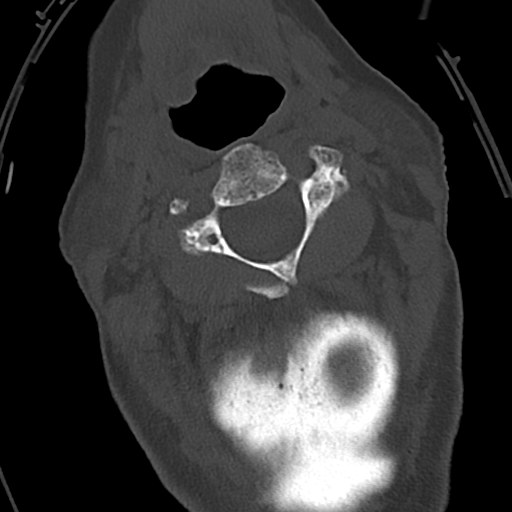

[12 of 33 positions shown; findings below may reference images not displayed]

FINDINGS: CT HEAD FINDINGS

Brain: No evidence of acute infarction, hemorrhage, hydrocephalus,
extra-axial collection or mass lesion/mass effect.

Mild cortical atrophy.

Mild subcortical white matter and periventricular small vessel
ischemic changes.

Vascular: Mild intracranial atherosclerosis.

Skull: Normal. Negative for fracture or focal lesion.

Sinuses/Orbits: The visualized paranasal sinuses are essentially
clear. The mastoid air cells are unopacified.

Other: None.

CT CERVICAL SPINE FINDINGS

Alignment: Normal.

Skull base and vertebrae: No acute fracture. No primary bone lesion
or focal pathologic process.

Soft tissues and spinal canal: No prevertebral fluid or swelling. No
visible canal hematoma.

Disc levels:  Mild multilevel degenerative changes.

Final canal is patent.

Upper chest: Visualized lung apices are clear.

Other: Visualized thyroid is unremarkable.
IMPRESSION: No evidence of acute intracranial abnormality. Atrophy with small
vessel ischemic changes.

No evidence of traumatic injury to the cervical spine. Mild
multilevel degenerative changes.

## 2019-05-23 IMAGING — CR DG CHEST 1V
1 series · 1 of 1 positions shown · non-contrast
Comparison: CT chest 11/23/2015

CLINICAL DATA: Hip fracture, fall

EXAM:
CHEST 1 VIEW

[chest ap]
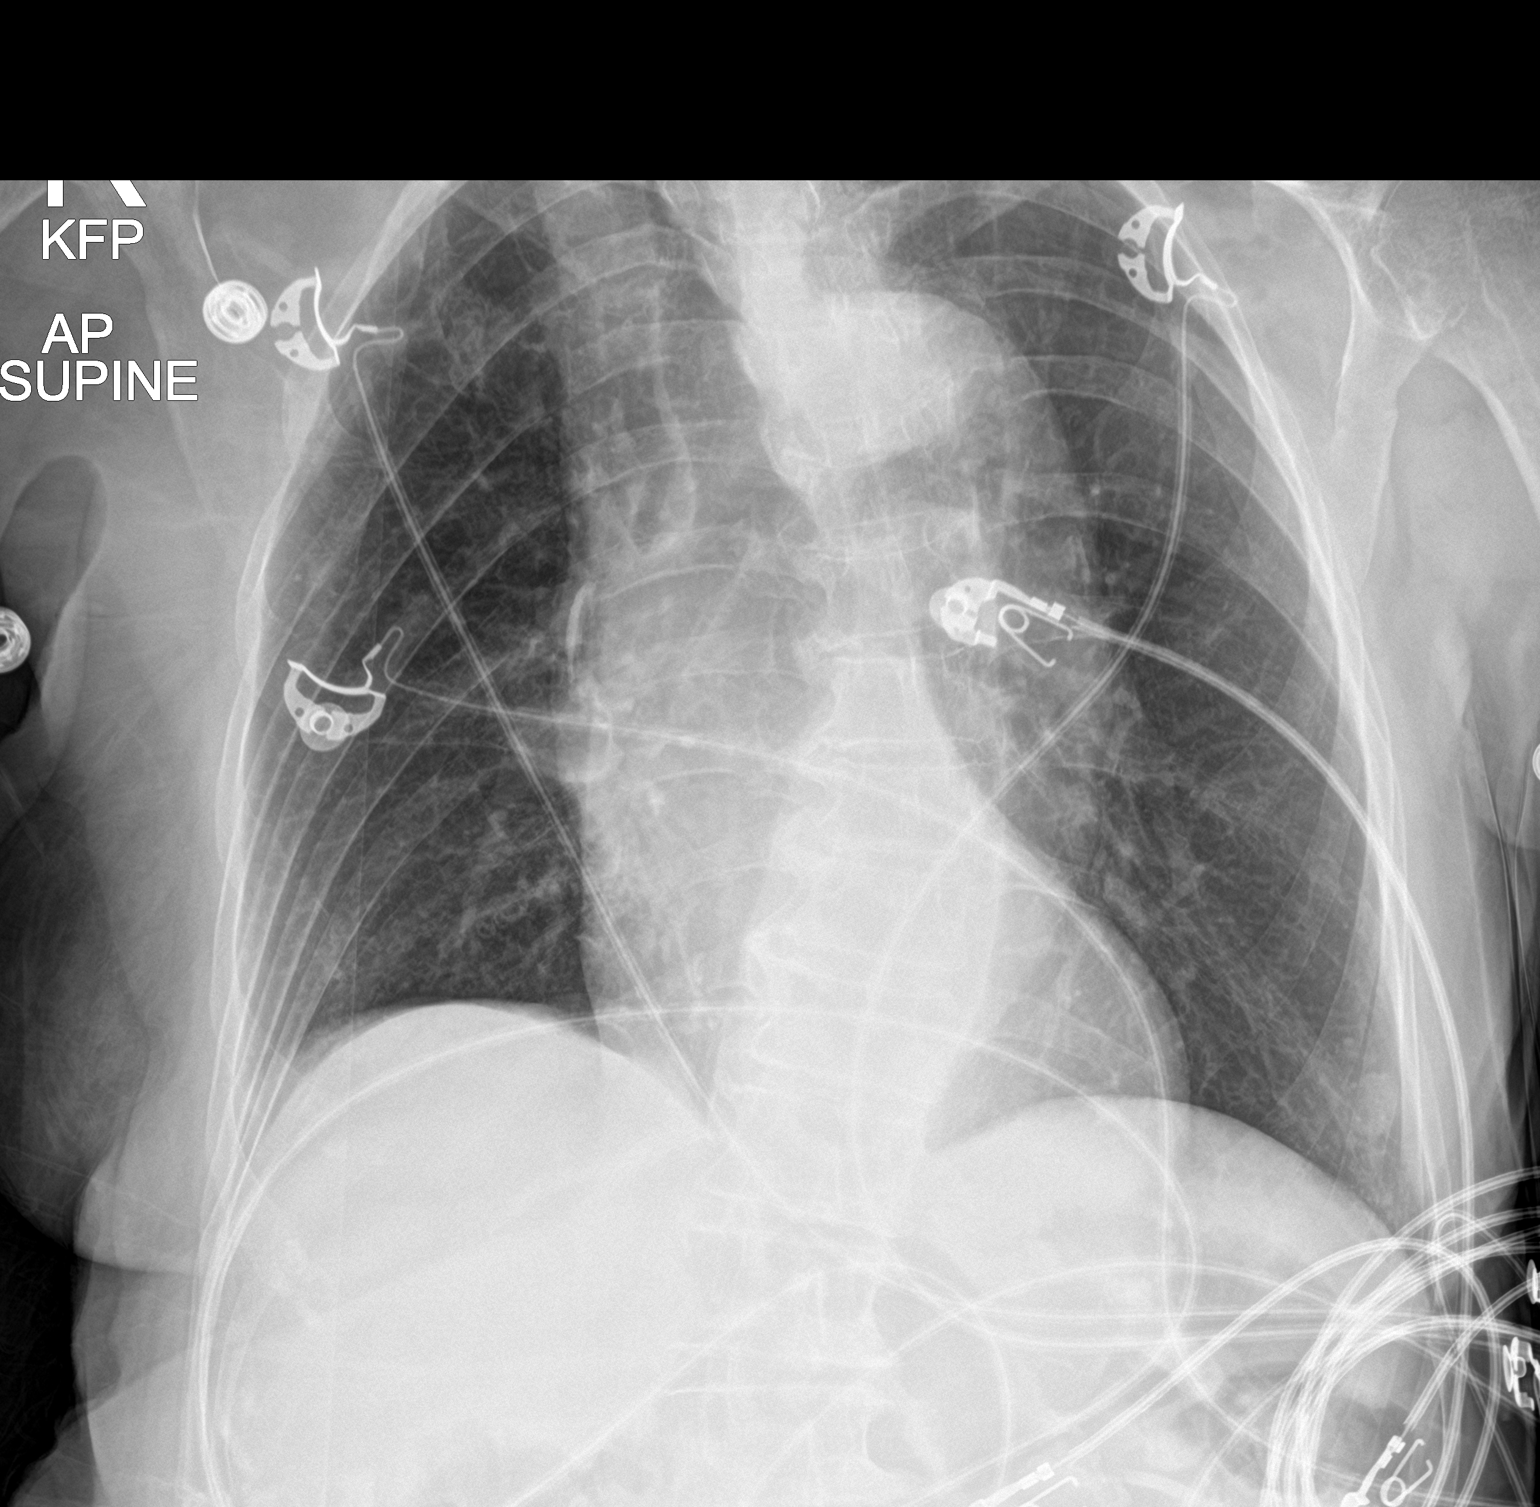

[1 of 1 positions shown; findings below may reference images not displayed]

FINDINGS: Normal heart size and pulmonary vascularity.

Tortuous aorta.

Lungs clear.

No pleural effusion or pneumothorax.

Question LEFT nipple shadow versus pulmonary nodule

Bones demineralized.
IMPRESSION: No acute abnormalities.

Question LEFT nipple shadow ; followup upright chest radiograph with
nipple markers recommended when the patient's clinical condition
permits to confirm nipple shadow and exclude pulmonary nodule.
